# Patient Record
Sex: Female | Born: 1955 | Race: White | Hispanic: No | Smoking: Never smoker
Health system: Southern US, Community
[De-identification: ages and names within clinical notes are randomized; demographics above are authoritative.]

## PROBLEM LIST (undated history)

## (undated) DIAGNOSIS — B029 Zoster without complications: Secondary | ICD-10-CM

## (undated) DIAGNOSIS — I471 Supraventricular tachycardia, unspecified: Secondary | ICD-10-CM

## (undated) DIAGNOSIS — E785 Hyperlipidemia, unspecified: Secondary | ICD-10-CM

## (undated) DIAGNOSIS — E039 Hypothyroidism, unspecified: Secondary | ICD-10-CM

## (undated) DIAGNOSIS — F1010 Alcohol abuse, uncomplicated: Secondary | ICD-10-CM

## (undated) DIAGNOSIS — J302 Other seasonal allergic rhinitis: Secondary | ICD-10-CM

## (undated) DIAGNOSIS — E079 Disorder of thyroid, unspecified: Secondary | ICD-10-CM

## (undated) DIAGNOSIS — I10 Essential (primary) hypertension: Secondary | ICD-10-CM

## (undated) DIAGNOSIS — I639 Cerebral infarction, unspecified: Secondary | ICD-10-CM

## (undated) DIAGNOSIS — F419 Anxiety disorder, unspecified: Secondary | ICD-10-CM

## (undated) DIAGNOSIS — F32A Depression, unspecified: Secondary | ICD-10-CM

## (undated) DIAGNOSIS — F329 Major depressive disorder, single episode, unspecified: Secondary | ICD-10-CM

## (undated) HISTORY — PX: TUBAL LIGATION: SHX77

## (undated) HISTORY — DX: Alcohol abuse, uncomplicated: F10.10

## (undated) HISTORY — DX: Supraventricular tachycardia, unspecified: I47.10

## (undated) HISTORY — DX: Other seasonal allergic rhinitis: J30.2

## (undated) HISTORY — DX: Supraventricular tachycardia: I47.1

## (undated) HISTORY — DX: Zoster without complications: B02.9

## (undated) HISTORY — DX: Hypothyroidism, unspecified: E03.9

## (undated) HISTORY — DX: Disorder of thyroid, unspecified: E07.9

## (undated) HISTORY — DX: Hyperlipidemia, unspecified: E78.5

---

## 1998-08-27 ENCOUNTER — Other Ambulatory Visit: Admission: RE | Admit: 1998-08-27 | Discharge: 1998-08-27 | Payer: Self-pay | Admitting: Family Medicine

## 2000-08-26 ENCOUNTER — Encounter: Admission: RE | Admit: 2000-08-26 | Discharge: 2000-08-26 | Payer: Self-pay | Admitting: Family Medicine

## 2001-01-05 ENCOUNTER — Encounter: Admission: RE | Admit: 2001-01-05 | Discharge: 2001-01-05 | Payer: Self-pay | Admitting: Family Medicine

## 2004-06-09 ENCOUNTER — Other Ambulatory Visit: Admission: RE | Admit: 2004-06-09 | Discharge: 2004-06-09 | Payer: Self-pay | Admitting: Family Medicine

## 2004-10-07 ENCOUNTER — Ambulatory Visit (HOSPITAL_COMMUNITY): Admission: RE | Admit: 2004-10-07 | Discharge: 2004-10-07 | Payer: Self-pay | Admitting: Family Medicine

## 2007-05-30 ENCOUNTER — Other Ambulatory Visit: Admission: RE | Admit: 2007-05-30 | Discharge: 2007-05-30 | Payer: Self-pay | Admitting: Family Medicine

## 2007-06-21 ENCOUNTER — Encounter (INDEPENDENT_AMBULATORY_CARE_PROVIDER_SITE_OTHER): Payer: Self-pay | Admitting: Family Medicine

## 2007-06-21 ENCOUNTER — Ambulatory Visit: Admission: RE | Admit: 2007-06-21 | Discharge: 2007-06-21 | Payer: Self-pay | Admitting: Family Medicine

## 2008-06-05 ENCOUNTER — Other Ambulatory Visit: Admission: RE | Admit: 2008-06-05 | Discharge: 2008-06-05 | Payer: Self-pay | Admitting: Family Medicine

## 2009-04-01 ENCOUNTER — Ambulatory Visit (HOSPITAL_COMMUNITY): Admission: RE | Admit: 2009-04-01 | Discharge: 2009-04-01 | Payer: Self-pay | Admitting: Family Medicine

## 2010-05-09 ENCOUNTER — Emergency Department (HOSPITAL_BASED_OUTPATIENT_CLINIC_OR_DEPARTMENT_OTHER)
Admission: EM | Admit: 2010-05-09 | Discharge: 2010-05-09 | Payer: Self-pay | Source: Home / Self Care | Admitting: Emergency Medicine

## 2010-07-20 ENCOUNTER — Other Ambulatory Visit: Payer: Self-pay | Admitting: Obstetrics and Gynecology

## 2010-07-27 LAB — POCT CARDIAC MARKERS
Troponin i, poc: 0.38 ng/mL (ref 0.00–0.09)
Troponin i, poc: <0.05 ng/mL (ref 0.00–0.09)

## 2010-07-27 LAB — DIFFERENTIAL
Basophils Relative: 0 % (ref 0–1)
Eosinophils Absolute: 0.3 10*3/uL (ref 0.0–0.7)
Lymphs Abs: 6.2 10*3/uL — ABNORMAL HIGH (ref 0.7–4.0)
Monocytes Absolute: 1.6 10*3/uL — ABNORMAL HIGH (ref 0.1–1.0)
Neutro Abs: 8.1 10*3/uL — ABNORMAL HIGH (ref 1.7–7.7)

## 2010-07-27 LAB — BASIC METABOLIC PANEL
CO2: 26 mEq/L (ref 19–32)
Glucose, Bld: 164 mg/dL — ABNORMAL HIGH (ref 70–99)
Potassium: 3.6 mEq/L (ref 3.5–5.1)
Sodium: 144 mEq/L (ref 135–145)

## 2010-07-27 LAB — CBC
HCT: 44.3 % (ref 36.0–46.0)
Hemoglobin: 15.9 g/dL — ABNORMAL HIGH (ref 12.0–15.0)
MCH: 30.5 pg (ref 26.0–34.0)
MCHC: 35.9 g/dL (ref 30.0–36.0)
MCV: 85 fL (ref 78.0–100.0)

## 2012-11-27 ENCOUNTER — Other Ambulatory Visit (HOSPITAL_COMMUNITY)
Admission: RE | Admit: 2012-11-27 | Discharge: 2012-11-27 | Disposition: A | Discharge status: Home / Self Care | Payer: BC Managed Care – PPO | Source: Ambulatory Visit | Attending: Family Medicine | Admitting: Family Medicine

## 2012-11-27 ENCOUNTER — Other Ambulatory Visit: Payer: Self-pay | Admitting: Physician Assistant

## 2012-11-27 DIAGNOSIS — Z0000 Encounter for general adult medical examination without abnormal findings: Secondary | ICD-10-CM | POA: Insufficient documentation

## 2013-05-15 ENCOUNTER — Other Ambulatory Visit (HOSPITAL_COMMUNITY): Payer: Self-pay | Admitting: Family Medicine

## 2013-05-15 DIAGNOSIS — Z1231 Encounter for screening mammogram for malignant neoplasm of breast: Secondary | ICD-10-CM

## 2013-05-25 ENCOUNTER — Ambulatory Visit (HOSPITAL_COMMUNITY)
Admission: RE | Admit: 2013-05-25 | Discharge: 2013-05-25 | Disposition: A | Discharge status: Home / Self Care | Payer: BC Managed Care – PPO | Source: Ambulatory Visit | Attending: Family Medicine | Admitting: Family Medicine

## 2013-05-25 DIAGNOSIS — Z1231 Encounter for screening mammogram for malignant neoplasm of breast: Secondary | ICD-10-CM | POA: Insufficient documentation

## 2014-03-09 ENCOUNTER — Encounter: Payer: Self-pay | Admitting: *Deleted

## 2014-04-14 ENCOUNTER — Inpatient Hospital Stay (HOSPITAL_COMMUNITY)
Admission: EM | Admit: 2014-04-14 | Discharge: 2014-04-17 | DRG: 065 | Disposition: A | Discharge status: Home / Self Care | Payer: BC Managed Care – PPO | Attending: Internal Medicine | Admitting: Internal Medicine

## 2014-04-14 ENCOUNTER — Encounter (HOSPITAL_COMMUNITY): Payer: Self-pay | Admitting: *Deleted

## 2014-04-14 DIAGNOSIS — F419 Anxiety disorder, unspecified: Secondary | ICD-10-CM | POA: Diagnosis present

## 2014-04-14 DIAGNOSIS — H532 Diplopia: Secondary | ICD-10-CM | POA: Diagnosis not present

## 2014-04-14 DIAGNOSIS — I63432 Cerebral infarction due to embolism of left posterior cerebral artery: Principal | ICD-10-CM | POA: Diagnosis present

## 2014-04-14 DIAGNOSIS — F329 Major depressive disorder, single episode, unspecified: Secondary | ICD-10-CM | POA: Diagnosis present

## 2014-04-14 DIAGNOSIS — Z8673 Personal history of transient ischemic attack (TIA), and cerebral infarction without residual deficits: Secondary | ICD-10-CM

## 2014-04-14 DIAGNOSIS — E785 Hyperlipidemia, unspecified: Secondary | ICD-10-CM | POA: Diagnosis present

## 2014-04-14 DIAGNOSIS — Z79899 Other long term (current) drug therapy: Secondary | ICD-10-CM

## 2014-04-14 DIAGNOSIS — I639 Cerebral infarction, unspecified: Secondary | ICD-10-CM

## 2014-04-14 DIAGNOSIS — E039 Hypothyroidism, unspecified: Secondary | ICD-10-CM | POA: Diagnosis present

## 2014-04-14 DIAGNOSIS — I10 Essential (primary) hypertension: Secondary | ICD-10-CM | POA: Diagnosis present

## 2014-04-14 DIAGNOSIS — F1099 Alcohol use, unspecified with unspecified alcohol-induced disorder: Secondary | ICD-10-CM | POA: Diagnosis present

## 2014-04-14 HISTORY — DX: Essential (primary) hypertension: I10

## 2014-04-14 HISTORY — DX: Cerebral infarction, unspecified: I63.9

## 2014-04-14 HISTORY — DX: Depression, unspecified: F32.A

## 2014-04-14 HISTORY — DX: Anxiety disorder, unspecified: F41.9

## 2014-04-14 HISTORY — DX: Major depressive disorder, single episode, unspecified: F32.9

## 2014-04-14 LAB — COMPREHENSIVE METABOLIC PANEL
ALBUMIN: 4.2 g/dL (ref 3.5–5.2)
ALT: 35 U/L (ref 0–35)
AST: 26 U/L (ref 0–37)
Alkaline Phosphatase: 106 U/L (ref 39–117)
Anion gap: 12 (ref 5–15)
BUN: 20 mg/dL (ref 6–23)
CALCIUM: 10.2 mg/dL (ref 8.4–10.5)
CHLORIDE: 97 meq/L (ref 96–112)
CO2: 30 mEq/L (ref 19–32)
Creatinine, Ser: 0.74 mg/dL (ref 0.50–1.10)
GFR calc Af Amer: >90 mL/min (ref >90)
GFR calc non Af Amer: >90 mL/min (ref >90)
Glucose, Bld: 100 mg/dL — ABNORMAL HIGH (ref 70–99)
Potassium: 3.6 mEq/L — ABNORMAL LOW (ref 3.7–5.3)
Sodium: 139 mEq/L (ref 137–147)
Total Bilirubin: 0.6 mg/dL (ref 0.3–1.2)
Total Protein: 7.8 g/dL (ref 6.0–8.3)

## 2014-04-14 LAB — DIFFERENTIAL
BASOS ABS: 0 10*3/uL (ref 0.0–0.1)
BASOS PCT: 0 % (ref 0–1)
EOS PCT: 2 % (ref 0–5)
Eosinophils Absolute: 0.2 10*3/uL (ref 0.0–0.7)
Lymphocytes Relative: 31 % (ref 12–46)
Lymphs Abs: 3.1 10*3/uL (ref 0.7–4.0)
Monocytes Absolute: 1 10*3/uL (ref 0.1–1.0)
Monocytes Relative: 10 % (ref 3–12)
NEUTROS ABS: 5.9 10*3/uL (ref 1.7–7.7)
Neutrophils Relative %: 57 % (ref 43–77)

## 2014-04-14 LAB — I-STAT TROPONIN, ED: TROPONIN I, POC: 0 ng/mL (ref 0.00–0.08)

## 2014-04-14 LAB — CBC
HEMATOCRIT: 44.5 % (ref 36.0–46.0)
Hemoglobin: 15.6 g/dL — ABNORMAL HIGH (ref 12.0–15.0)
MCH: 31.1 pg (ref 26.0–34.0)
MCHC: 35.1 g/dL (ref 30.0–36.0)
MCV: 88.8 fL (ref 78.0–100.0)
PLATELETS: 296 10*3/uL (ref 150–400)
RBC: 5.01 MIL/uL (ref 3.87–5.11)
RDW: 11.8 % (ref 11.5–15.5)
WBC: 10.2 10*3/uL (ref 4.0–10.5)

## 2014-04-14 LAB — PROTIME-INR
INR: 1.06 (ref 0.00–1.49)
PROTHROMBIN TIME: 13.9 s (ref 11.6–15.2)

## 2014-04-14 LAB — APTT: APTT: 31 s (ref 24–37)

## 2014-04-14 NOTE — ED Notes (Signed)
Pt reports onset Wednesday of dizziness, double vision and generalized fatigue. Grips are equal and no neuro deficits noted at triage.

## 2014-04-14 NOTE — ED Notes (Signed)
Pt placed in a gown with cardiac monitoring, BP cuff and pulse ox

## 2014-04-14 NOTE — ED Provider Notes (Signed)
CSN: [id number]     Arrival date & time 04/14/14  1805 History   First MD Initiated Contact with Patient 04/14/14 2011     Chief Complaint  Patient presents with  . Dizziness  . Diplopia  . Fatigue    (Consider location/radiation/quality/duration/timing/severity/associated sxs/prior Treatment) HPI Comments: Patient with history of hypothyroidism, high cholesterol, high blood pressure was dense with complaint of binocular diplopia beginning 4 days ago. At the onset the patient had an episode of dizziness described as a spinning sensation. Patient states that she went to sleep and when she awoke she was having improved dizziness but double vision. Double vision is constant and described as "annoying". When she closes one eye the double vision resolves. Patient sees 2 objects that can be beside each other or on top of one another. She thinks that this is slightly worse with leftward gaze or when the objects are right in front of her. Patient denies signs of stroke including: facial droop, slurred speech, aphasia, weakness/numbness in extremities, imbalance/trouble walking. She does have family histroy of stroke. No fever, neck pain, N/V/D. No treatments PTA. She has never had symptoms like this in the past. Her last synthroid adjustment was 8-10 months ago. No blinking lights in her vision or loss of vision. No head injuries. No pain around her eye or headache.   Patient is a 58 y.o. female presenting with dizziness. The history is provided by the patient.  Dizziness Associated symptoms: headaches   Associated symptoms: no chest pain, no nausea, no shortness of breath and no vomiting     Past Medical History  Diagnosis Date  . Thyroid disease   . Hyperlipidemia   . Hypothyroidism   . SVT (supraventricular tachycardia)   . Shingles    History reviewed. No pertinent past surgical history. Family History  Problem Relation Age of Onset  . Coronary artery disease Mother   . Coronary artery  disease Brother   . Heart attack Brother    History  Substance Use Topics  . Smoking status: Never Smoker   . Smokeless tobacco: Not on file  . Alcohol Use: No   OB History    No data available     Review of Systems  Constitutional: Negative for fever.  HENT: Negative for congestion, dental problem, rhinorrhea and sinus pressure.   Eyes: Negative for photophobia, discharge, redness and visual disturbance.  Respiratory: Negative for shortness of breath.   Cardiovascular: Negative for chest pain.  Gastrointestinal: Negative for nausea and vomiting.  Musculoskeletal: Negative for gait problem, neck pain and neck stiffness.  Skin: Negative for rash.  Neurological: Positive for dizziness and headaches. Negative for syncope, speech difficulty, weakness, light-headedness and numbness.  Psychiatric/Behavioral: Negative for confusion.    Allergies  Amoxicillin  Home Medications   Prior to Admission medications   Medication Sig Start Date End Date Taking? Authorizing Provider  ALPRAZolam Prudy Feeler(XANAX) 0.5 MG tablet Take 0.5 mg by mouth at bedtime as needed for anxiety.   Yes Historical Provider, MD  atorvastatin (LIPITOR) 40 MG tablet Take 40 mg by mouth daily.   Yes Historical Provider, MD  DULoxetine (CYMBALTA) 60 MG capsule Take 60 mg by mouth daily.   Yes Historical Provider, MD  levothyroxine (SYNTHROID, LEVOTHROID) 112 MCG tablet Take 124 mcg by mouth daily.    Yes Historical Provider, MD  lisinopril-hydrochlorothiazide (PRINZIDE,ZESTORETIC) 20-25 MG per tablet Take 1 tablet by mouth daily.   Yes Historical Provider, MD  metoprolol succinate (TOPROL-XL) 100 MG 24 hr tablet  Take 100 mg by mouth daily. Take with or immediately following a meal.   Yes Historical Provider, MD   BP 120/93 mmHg  Pulse 70  Temp(Src) 97.5 F (36.4 C) (Oral)  Resp 20  Ht 5\' 2"  (1.575 m)  Wt 177 lb (80.287 kg)  BMI 32.37 kg/m2  SpO2 96%   Physical Exam  Constitutional: She is oriented to person, place,  and time. She appears well-developed and well-nourished.  HENT:  Head: Normocephalic and atraumatic.  Right Ear: Tympanic membrane, external ear and ear canal normal.  Left Ear: Tympanic membrane, external ear and ear canal normal.  Nose: Nose normal.  Mouth/Throat: Uvula is midline, oropharynx is clear and moist and mucous membranes are normal.  Eyes: Conjunctivae, EOM and lids are normal. Pupils are equal, round, and reactive to light. Right eye exhibits no nystagmus. Left eye exhibits no nystagmus.  Neck: Normal range of motion. Neck supple.  Cardiovascular: Normal rate and regular rhythm.   No murmur heard. Pulmonary/Chest: Effort normal and breath sounds normal. No respiratory distress. She has no wheezes. She has no rales.  Abdominal: Soft. There is no tenderness.  Musculoskeletal:       Cervical back: She exhibits normal range of motion, no tenderness and no bony tenderness.  Neurological: She is alert and oriented to person, place, and time. She has normal strength and normal reflexes. A cranial nerve deficit is present. No sensory deficit. She displays a negative Romberg sign. Coordination and gait normal. GCS eye subscore is 4. GCS verbal subscore is 5. GCS motor subscore is 6.  Right eye deviates downward when eye covered.   Skin: Skin is warm and dry.  Psychiatric: She has a normal mood and affect.  Nursing note and vitals reviewed.   ED Course  Procedures (including critical care time) Labs Review Labs Reviewed  CBC - Abnormal; Notable for the following:    Hemoglobin 15.6 (*)    All other components within normal limits  COMPREHENSIVE METABOLIC PANEL - Abnormal; Notable for the following:    Potassium 3.6 (*)    Glucose, Bld 100 (*)    All other components within normal limits  PROTIME-INR  APTT  DIFFERENTIAL  I-STAT TROPOININ, ED    Imaging Review No results found.   EKG Interpretation None       8:58 PM Patient seen and examined. Work-up initiated.   Vital signs reviewed and are as follows: BP 120/93 mmHg  Pulse 70  Temp(Src) 97.5 F (36.4 C) (Oral)  Resp 20  Ht 5\' 2"  (1.575 m)  Wt 177 lb (80.287 kg)  BMI 32.37 kg/m2  SpO2 96%  11:08 PM Patient discussed with and seen previously by Dr. Rhunette CroftNanavati. I spoke with Dr. Roseanne RenoStewart earlier regarding this. He reccs MRI if patient is having binocular diplopia. Plan: Will obtain MRI in morning. Patient is willing to stay for this.   If the MRI is negative, she can be discharged home with PCP follow-up for completion of stroke work-up. If it is positive, she will need admitted.   1:19 AM Handoff to Upstill PA-C at shift change.   BP 137/81 mmHg  Pulse 69  Temp(Src) 98 F (36.7 C) (Oral)  Resp 17  Ht 5\' 2"  (1.575 m)  Wt 177 lb (80.287 kg)  BMI 32.37 kg/m2  SpO2 94%   MDM   Final diagnoses:  Diplopia   Pending completion of work-up. Due to MRI availability -- patient will hold in ED overnight and get scan in morning.  She is in agreement.      Renne Crigler, PA-C 04/15/14 1610  Derwood Kaplan, MD 04/21/14 [phone]

## 2014-04-15 ENCOUNTER — Inpatient Hospital Stay (HOSPITAL_COMMUNITY): Payer: BC Managed Care – PPO

## 2014-04-15 ENCOUNTER — Encounter (HOSPITAL_COMMUNITY): Payer: Self-pay | Admitting: General Practice

## 2014-04-15 ENCOUNTER — Emergency Department (HOSPITAL_COMMUNITY): Payer: BC Managed Care – PPO

## 2014-04-15 DIAGNOSIS — Z79899 Other long term (current) drug therapy: Secondary | ICD-10-CM | POA: Diagnosis not present

## 2014-04-15 DIAGNOSIS — E039 Hypothyroidism, unspecified: Secondary | ICD-10-CM | POA: Diagnosis present

## 2014-04-15 DIAGNOSIS — I517 Cardiomegaly: Secondary | ICD-10-CM

## 2014-04-15 DIAGNOSIS — H532 Diplopia: Secondary | ICD-10-CM | POA: Diagnosis present

## 2014-04-15 DIAGNOSIS — E038 Other specified hypothyroidism: Secondary | ICD-10-CM

## 2014-04-15 DIAGNOSIS — E785 Hyperlipidemia, unspecified: Secondary | ICD-10-CM | POA: Diagnosis present

## 2014-04-15 DIAGNOSIS — F329 Major depressive disorder, single episode, unspecified: Secondary | ICD-10-CM | POA: Diagnosis present

## 2014-04-15 DIAGNOSIS — I10 Essential (primary) hypertension: Secondary | ICD-10-CM

## 2014-04-15 DIAGNOSIS — F419 Anxiety disorder, unspecified: Secondary | ICD-10-CM | POA: Diagnosis present

## 2014-04-15 DIAGNOSIS — Z8673 Personal history of transient ischemic attack (TIA), and cerebral infarction without residual deficits: Secondary | ICD-10-CM | POA: Diagnosis not present

## 2014-04-15 DIAGNOSIS — I639 Cerebral infarction, unspecified: Secondary | ICD-10-CM | POA: Diagnosis present

## 2014-04-15 DIAGNOSIS — F1099 Alcohol use, unspecified with unspecified alcohol-induced disorder: Secondary | ICD-10-CM | POA: Diagnosis present

## 2014-04-15 DIAGNOSIS — I63432 Cerebral infarction due to embolism of left posterior cerebral artery: Secondary | ICD-10-CM | POA: Diagnosis present

## 2014-04-15 LAB — CBC
HEMATOCRIT: 41.9 % (ref 36.0–46.0)
Hemoglobin: 14.4 g/dL (ref 12.0–15.0)
MCH: 30.4 pg (ref 26.0–34.0)
MCHC: 34.4 g/dL (ref 30.0–36.0)
MCV: 88.6 fL (ref 78.0–100.0)
Platelets: 265 10*3/uL (ref 150–400)
RBC: 4.73 MIL/uL (ref 3.87–5.11)
RDW: 11.8 % (ref 11.5–15.5)
WBC: 9.5 10*3/uL (ref 4.0–10.5)

## 2014-04-15 LAB — CREATININE, SERUM
Creatinine, Ser: 0.83 mg/dL (ref 0.50–1.10)
GFR calc Af Amer: 90 mL/min — ABNORMAL LOW (ref >90)
GFR, EST NON AFRICAN AMERICAN: 77 mL/min — AB (ref >90)

## 2014-04-15 MED ORDER — ATORVASTATIN CALCIUM 40 MG PO TABS
40.0000 mg | ORAL_TABLET | Freq: Every day | ORAL | Status: DC
  Administered 2014-04-15 – 2014-04-16 (×2): 40 mg via ORAL
  Filled 2014-04-15 (×4): qty 1

## 2014-04-15 MED ORDER — HYDROCHLOROTHIAZIDE 25 MG PO TABS
25.0000 mg | ORAL_TABLET | Freq: Every day | ORAL | Status: DC
  Administered 2014-04-15: 25 mg via ORAL
  Filled 2014-04-15: qty 1

## 2014-04-15 MED ORDER — SODIUM CHLORIDE 0.9 % IV SOLN: 250.0000 mL | INTRAVENOUS | Status: DC | PRN

## 2014-04-15 MED ORDER — INFLUENZA VAC SPLIT QUAD 0.5 ML IM SUSY
0.5000 mL | PREFILLED_SYRINGE | INTRAMUSCULAR | Status: AC
  Administered 2014-04-16: 0.5 mL via INTRAMUSCULAR
  Filled 2014-04-15: qty 0.5

## 2014-04-15 MED ORDER — ASPIRIN 81 MG PO CHEW
81.0000 mg | CHEWABLE_TABLET | Freq: Every day | ORAL | Status: DC
  Administered 2014-04-15 – 2014-04-16 (×2): 81 mg via ORAL
  Filled 2014-04-15 (×3): qty 1

## 2014-04-15 MED ORDER — VITAMIN B-1 100 MG PO TABS
100.0000 mg | ORAL_TABLET | Freq: Every day | ORAL | Status: DC
  Administered 2014-04-15 – 2014-04-16 (×2): 100 mg via ORAL
  Filled 2014-04-15 (×3): qty 1

## 2014-04-15 MED ORDER — FOLIC ACID 1 MG PO TABS
1.0000 mg | ORAL_TABLET | Freq: Every day | ORAL | Status: DC
  Administered 2014-04-15 – 2014-04-16 (×2): 1 mg via ORAL
  Filled 2014-04-15 (×3): qty 1

## 2014-04-15 MED ORDER — ADULT MULTIVITAMIN W/MINERALS CH
1.0000 | ORAL_TABLET | Freq: Every day | ORAL | Status: DC
  Administered 2014-04-15 – 2014-04-16 (×2): 1 via ORAL
  Filled 2014-04-15 (×3): qty 1

## 2014-04-15 MED ORDER — ACETAMINOPHEN 650 MG RE SUPP: 650.0000 mg | Freq: Four times a day (QID) | RECTAL | Status: DC | PRN

## 2014-04-15 MED ORDER — SODIUM CHLORIDE 0.9 % IJ SOLN
3.0000 mL | Freq: Two times a day (BID) | INTRAMUSCULAR | Status: DC
  Administered 2014-04-15: 3 mL via INTRAVENOUS

## 2014-04-15 MED ORDER — LORAZEPAM 2 MG/ML IJ SOLN: 1.0000 mg | Freq: Four times a day (QID) | INTRAMUSCULAR | Status: DC | PRN

## 2014-04-15 MED ORDER — SODIUM CHLORIDE 0.9 % IJ SOLN
3.0000 mL | Freq: Two times a day (BID) | INTRAMUSCULAR | Status: DC
  Administered 2014-04-15 – 2014-04-16 (×2): 3 mL via INTRAVENOUS

## 2014-04-15 MED ORDER — LEVOTHYROXINE SODIUM 112 MCG PO TABS
224.0000 ug | ORAL_TABLET | Freq: Every day | ORAL | Status: DC
  Administered 2014-04-15 – 2014-04-16 (×2): 224 ug via ORAL
  Filled 2014-04-15 (×4): qty 2

## 2014-04-15 MED ORDER — DOCUSATE SODIUM 100 MG PO CAPS
100.0000 mg | ORAL_CAPSULE | Freq: Two times a day (BID) | ORAL | Status: DC
  Administered 2014-04-15: 100 mg via ORAL
  Filled 2014-04-15 (×5): qty 1

## 2014-04-15 MED ORDER — LEVOTHYROXINE SODIUM 25 MCG PO TABS: 124.0000 ug | ORAL_TABLET | Freq: Every day | ORAL | Status: DC

## 2014-04-15 MED ORDER — HYDROCODONE-ACETAMINOPHEN 5-325 MG PO TABS: 1.0000 | ORAL_TABLET | ORAL | Status: DC | PRN

## 2014-04-15 MED ORDER — SODIUM CHLORIDE 0.9 % IJ SOLN: 3.0000 mL | INTRAMUSCULAR | Status: DC | PRN

## 2014-04-15 MED ORDER — ONDANSETRON HCL 4 MG PO TABS
4.0000 mg | ORAL_TABLET | Freq: Four times a day (QID) | ORAL | Status: DC | PRN
Start: 2014-04-15 — End: 2014-04-17

## 2014-04-15 MED ORDER — DULOXETINE HCL 60 MG PO CPEP
60.0000 mg | ORAL_CAPSULE | Freq: Every day | ORAL | Status: DC
  Administered 2014-04-15 – 2014-04-16 (×2): 60 mg via ORAL
  Filled 2014-04-15 (×3): qty 1

## 2014-04-15 MED ORDER — HEPARIN SODIUM (PORCINE) 5000 UNIT/ML IJ SOLN
5000.0000 [IU] | Freq: Three times a day (TID) | INTRAMUSCULAR | Status: DC
  Administered 2014-04-15 – 2014-04-17 (×5): 5000 [IU] via SUBCUTANEOUS
  Filled 2014-04-15 (×7): qty 1

## 2014-04-15 MED ORDER — ONDANSETRON HCL 4 MG/2ML IJ SOLN: 4.0000 mg | Freq: Four times a day (QID) | INTRAMUSCULAR | Status: DC | PRN

## 2014-04-15 MED ORDER — ALPRAZOLAM 0.5 MG PO TABS: 0.5000 mg | ORAL_TABLET | Freq: Every evening | ORAL | Status: DC | PRN

## 2014-04-15 MED ORDER — METOPROLOL SUCCINATE ER 100 MG PO TB24
100.0000 mg | ORAL_TABLET | Freq: Every day | ORAL | Status: DC
  Administered 2014-04-15: 100 mg via ORAL
  Filled 2014-04-15: qty 4

## 2014-04-15 MED ORDER — LORAZEPAM 1 MG PO TABS: 1.0000 mg | ORAL_TABLET | Freq: Four times a day (QID) | ORAL | Status: DC | PRN

## 2014-04-15 MED ORDER — ACETAMINOPHEN 325 MG PO TABS: 650.0000 mg | ORAL_TABLET | Freq: Four times a day (QID) | ORAL | Status: DC | PRN

## 2014-04-15 MED ORDER — LISINOPRIL 20 MG PO TABS
20.0000 mg | ORAL_TABLET | Freq: Every day | ORAL | Status: DC
  Administered 2014-04-15: 20 mg via ORAL
  Filled 2014-04-15: qty 1

## 2014-04-15 MED ORDER — THIAMINE HCL 100 MG/ML IJ SOLN
100.0000 mg | Freq: Every day | INTRAMUSCULAR | Status: DC
  Filled 2014-04-15 (×3): qty 1

## 2014-04-15 MED ORDER — GADOBENATE DIMEGLUMINE 529 MG/ML IV SOLN
17.0000 mL | Freq: Once | INTRAVENOUS | Status: AC | PRN
  Administered 2014-04-15: 17 mL via INTRAVENOUS

## 2014-04-15 NOTE — Progress Notes (Signed)
PT Cancellation Note  Patient Details Name: Nicholaus CorollaJoan Pickler MRN: [id number] DOB: 07-23-1955   Cancelled Treatment:    Reason Eval/Treat Not Completed: Patient at procedure or test/unavailable (Pt in MRI.  )   Berline LopesWhite, Malasha Kleppe F 04/15/2014, 2:18 PM  Andreea Arca,PT Acute Rehabilitation [phone] (pager)

## 2014-04-15 NOTE — ED Provider Notes (Signed)
6:35 AM Pt signed out at shift change by Elpidio AnisShari Upstill, PA-C.  Pt presented to ED last night with c/o diplopia.  Pt was discussed with Dr. Rhunette CroftNanavati and Dr. Roseanne RenoStewart.  MRI was recommended due to pt having binocular diplopia.  Plan is to f/u on MRI. If normal, pt may f/u with PCP, Dr. Fernanda DrumSwayne, [facility].  7:56 AM MRI Brain- significant for acute left thalamic infarct. Minimal chronic small vessel ischemia disease.  Will consult with neurology again.   8:06 AM Consulted with neurology who reviewed MRI, will come see pt. Requested pt be admitted to medicine service.   8:49 AM Consulted with Dr. Criselda PeachesMullen, who agreed to admit pt for continued care.  Pt is stable at this time.   Results for orders placed or performed during the hospital encounter of 04/14/14  Protime-INR  Result Value Ref Range   Prothrombin Time 13.9 11.6 - 15.2 seconds   INR 1.06 0.00 - 1.49  APTT  Result Value Ref Range   aPTT 31 24 - 37 seconds  CBC  Result Value Ref Range   WBC 10.2 4.0 - 10.5 K/uL   RBC 5.01 3.87 - 5.11 MIL/uL   Hemoglobin 15.6 (H) 12.0 - 15.0 g/dL   HCT 16.144.5 09.636.0 - 04.546.0 %   MCV 88.8 78.0 - 100.0 fL   MCH 31.1 26.0 - 34.0 pg   MCHC 35.1 30.0 - 36.0 g/dL   RDW 40.911.8 81.111.5 - 91.415.5 %   Platelets 296 150 - 400 K/uL  Differential  Result Value Ref Range   Neutrophils Relative % 57 43 - 77 %   Neutro Abs 5.9 1.7 - 7.7 K/uL   Lymphocytes Relative 31 12 - 46 %   Lymphs Abs 3.1 0.7 - 4.0 K/uL   Monocytes Relative 10 3 - 12 %   Monocytes Absolute 1.0 0.1 - 1.0 K/uL   Eosinophils Relative 2 0 - 5 %   Eosinophils Absolute 0.2 0.0 - 0.7 K/uL   Basophils Relative 0 0 - 1 %   Basophils Absolute 0.0 0.0 - 0.1 K/uL  Comprehensive metabolic panel  Result Value Ref Range   Sodium 139 137 - 147 mEq/L   Potassium 3.6 (L) 3.7 - 5.3 mEq/L   Chloride 97 96 - 112 mEq/L   CO2 30 19 - 32 mEq/L   Glucose, Bld 100 (H) 70 - 99 mg/dL   BUN 20 6 - 23 mg/dL   Creatinine, Ser 7.820.74 0.50 - 1.10 mg/dL   Calcium 95.610.2 8.4 -  21.310.5 mg/dL   Total Protein 7.8 6.0 - 8.3 g/dL   Albumin 4.2 3.5 - 5.2 g/dL   AST 26 0 - 37 U/L   ALT 35 0 - 35 U/L   Alkaline Phosphatase 106 39 - 117 U/L   Total Bilirubin 0.6 0.3 - 1.2 mg/dL   GFR calc non Af Amer >90 >90 mL/min   GFR calc Af Amer >90 >90 mL/min   Anion gap 12 5 - 15  I-stat troponin, ED ([facility])  Result Value Ref Range   Troponin i, poc 0.00 0.00 - 0.08 ng/mL   Comment 3           Mr Brain Wo Contrast  04/15/2014   CLINICAL DATA:  Diplopia.  EXAM: MRI HEAD WITHOUT CONTRAST  TECHNIQUE: Multiplanar, multiecho pulse sequences of the brain and surrounding structures were obtained without intravenous contrast.  COMPARISON:  None.  FINDINGS: There are small foci of acute infarction in the left  thalamus slightly extending into the left cerebral peduncle. There is no evidence of intracranial hemorrhage, mass, midline shift, or extra-axial fluid collection. Ventricles and sulci are within normal limits for age. Scattered, small foci of T2 hyperintensity in the subcortical and deep cerebral white matter bilaterally are nonspecific but compatible with minimal chronic small vessel ischemic disease.  Orbits are unremarkable. There is mild right anterior ethmoid air cell mucosal thickening. Small left mastoid effusion is noted. Major intracranial vascular flow voids are preserved.  IMPRESSION: 1. Acute left thalamic infarct. 2. Minimal chronic small vessel ischemic disease.   Electronically Signed   By: Sebastian AcheAllen  Grady   On: 04/15/2014 07:44       Junius FinnerErin O'Malley, PA-C 04/15/14 [id number]  Ward GivensIva L Knapp, MD 04/15/14 [phone]

## 2014-04-15 NOTE — ED Provider Notes (Signed)
[place] pending MRI in am to r/o CVA acutely. PCP f/u if negative. Consider outpatient ophtho f/u facilitated by PCP.  MRI pending at morning shift change. [facility] for result review and disposition.  Arnoldo HookerShari A Jaylyne Breese, PA-C 04/17/14 1914

## 2014-04-15 NOTE — H&P (Signed)
[facility] History and Physical  [name] Wagner ZOX:[id number] DOB: 11-18-1955 DOA: 04/14/2014  Referring physician: ED physician PCP: Sissy Hoff, MD   Chief Complaint: dizziness, diplopia  HPI:  [name] Wagner is a 58yo woman with PMH of HTN, hypothyroidism, h/o SVT and HLD who presented due to above.  [name] Wagner reports that she began having symptoms on Wednesday, particularly dizziness and blurred vision and double vision.  She reports that she had to work, so she did not come in to be evaluated.  The dizziness improved after one day, but the vision changes persisted.  She denied any other symptoms including chest pain, fever, chills, nausea, vomiting, diarrhea, constipation, change in hearing, tingling, weakness, lightheadedness, syncope, change in sensation, inability to walk or gait changes.  Per her daughter, she had some mental status changes this morning and possibly some slurred speech, but these have all completely resolved.  The double vision is the only thing that persists.   Assessment and Plan: Principal Problem:    CVA; left thalamic stroke - MRI and MRA as noted below - Check A1C, Lipid panel - Allow permissive HTN - Neurology is following along - TTE, CD pending - PT/OT/SLP - she passed bedside swallow - Good risk factor management - Aspirin, statin  Active Problems:    HTN (hypertension) - Allow permissive HTN, hold BP medications including metoprolol, lisinopril-hctz - She is normotensive now, had BP medications this morning.     HLD (hyperlipidemia) - Continue home atorvastatin    Hypothyroidism - Continue home synthroid  ETOH use - Reports 1 bottle of wine a night - CIWA protocol as she will not get any ETOH here.  She does not report any history of withdrawal.    Depression/anxiety - Continue home cymbalta and xanax   Radiological Exams on Admission: Mr Shirlee Latch WJ Contrast  04/15/2014   ADDENDUM REPORT: 04/15/2014 14:18  ADDENDUM:  Study discussed by telephone with Neurohospitalist Dr. Elspeth Cho On 04/15/2014 at 1414 hrs.   Electronically Signed   By: Augusto Gamble M.D.   On: 04/15/2014 14:18   04/15/2014   CLINICAL DATA:  58 year old female with small acute left thalamic and midbrain infarct detected on MRI for evaluation of diplopia. Initial encounter.  EXAM: MRA NECK WITHOUT AND WITH CONTRAST  MRA HEAD WITHOUT CONTRAST  TECHNIQUE: Multiplanar and multiecho pulse sequences of the neck were obtained without and with intravenous contrast. Angiographic images of the neck were obtained using MRA technique without and with intravenous contast.; Angiographic images of the Circle of Willis were obtained using MRA technique without intravenous contrast.  CONTRAST:  17mL MULTIHANCE GADOBENATE DIMEGLUMINE 529 MG/ML IV SOLN  COMPARISON:  Brain MRI without contrast 0644 hr the same day.  FINDINGS: MRA NECK FINDINGS  Pre contrast time-of-flight neck MRA images. Antegrade flow in both carotid and vertebral arteries in the neck. Fairly codominant vertebral arteries.  Post-contrast MRA images. Three vessel arch configuration with no great vessel origin stenosis.  Moderate irregularity at the right carotid bifurcation (series 19147, image 15) but with no hemodynamically significant stenosis at the right ICA origin or bulb. Negative cervical right ICA otherwise.  Minimal to mild irregularity at the left carotid bifurcation and bulb. No cervical left ICA stenosis.  Normal left vertebral artery origin, the right is not well visualized. Slight dominance of the left vertebral artery. No vertebral artery stenosis elsewhere in the neck. Negative distal vertebral arteries, both PICA origins are patent.  MRA HEAD FINDINGS  Antegrade flow in the posterior  circulation with fairly codominant distal vertebral arteries. Normal PICA origins. Normal vertebrobasilar junction. No basilar artery irregularity or stenosis. SCA origins are within normal limits.  There is a fetal  type right PCA origin, however the left PCA origin is conventional (there is a small left posterior communicating artery, and there is a short 3 mm segment of high-grade stenosis or occlusion in the left P1. See series 3, image 54.  Otherwise the bilateral PCA branches are within normal limits.  Antegrade flow in both ICA siphons. No siphon stenosis. Ophthalmic and posterior communicating artery origins are within normal limits. Normal carotid termini, MCA and ACA origins. Anterior communicating artery is diminutive or absent. Visualized bilateral ACA branches are within normal limits. Visualized bilateral MCA branches are within normal limits.  IMPRESSION: 1. Left PCA P1 short 3 mm segment of occlusion or high-grade stenosis corresponding to the acute left midbrain and thalamus striate artery territory infarct. 2. Otherwise negative intracranial and extracranial posterior circulation. 3. Right greater than left carotid bifurcation atherosclerosis, but no anterior circulation hemodynamically significant stenosis.  Electronically Signed: By: Augusto GambleLee  Hall M.D. On: 04/15/2014 13:54   [name]  04/15/2014   ADDENDUM REPORT: 04/15/2014 14:18  ADDENDUM: Study discussed by telephone with Neurohospitalist Dr. Elspeth ChoPeter Sumner On 04/15/2014 at 1414 hrs.   Electronically Signed   By: Augusto GambleLee  Hall M.D.   On: 04/15/2014 14:18   04/15/2014   CLINICAL DATA:  58 year old female with small acute left thalamic and midbrain infarct detected on MRI for evaluation of diplopia. Initial encounter.  EXAM: MRA NECK WITHOUT AND WITH CONTRAST  MRA HEAD WITHOUT CONTRAST  TECHNIQUE: Multiplanar and multiecho pulse sequences of the neck were obtained without and with intravenous contrast. Angiographic images of the neck were obtained using MRA technique without and with intravenous contast.; Angiographic images of the Circle of Willis were obtained using MRA technique without intravenous contrast.  CONTRAST:  17mL MULTIHANCE  GADOBENATE DIMEGLUMINE 529 MG/ML IV SOLN  COMPARISON:  Brain MRI without contrast 0644 hr the same day.  FINDINGS: MRA NECK FINDINGS  Pre contrast time-of-flight neck MRA images. Antegrade flow in both carotid and vertebral arteries in the neck. Fairly codominant vertebral arteries.  Post-contrast MRA images. Three vessel arch configuration with no great vessel origin stenosis.  Moderate irregularity at the right carotid bifurcation (series [id number], image 15) but with no hemodynamically significant stenosis at the right ICA origin or bulb. Negative cervical right ICA otherwise.  Minimal to mild irregularity at the left carotid bifurcation and bulb. No cervical left ICA stenosis.  Normal left vertebral artery origin, the right is not well visualized. Slight dominance of the left vertebral artery. No vertebral artery stenosis elsewhere in the neck. Negative distal vertebral arteries, both PICA origins are patent.  MRA HEAD FINDINGS  Antegrade flow in the posterior circulation with fairly codominant distal vertebral arteries. Normal PICA origins. Normal vertebrobasilar junction. No basilar artery irregularity or stenosis. SCA origins are within normal limits.  There is a fetal type right PCA origin, however the left PCA origin is conventional (there is a small left posterior communicating artery, and there is a short 3 mm segment of high-grade stenosis or occlusion in the left P1. See series 3, image 54.  Otherwise the bilateral PCA branches are within normal limits.  Antegrade flow in both ICA siphons. No siphon stenosis. Ophthalmic and posterior communicating artery origins are within normal limits. Normal carotid termini, MCA and ACA origins. Anterior communicating artery is diminutive or  absent. Visualized bilateral ACA branches are within normal limits. Visualized bilateral MCA branches are within normal limits.  IMPRESSION: 1. Left PCA P1 short 3 mm segment of occlusion or high-grade stenosis corresponding to the  acute left midbrain and thalamus striate artery territory infarct. 2. Otherwise negative intracranial and extracranial posterior circulation. 3. Right greater than left carotid bifurcation atherosclerosis, but no anterior circulation hemodynamically significant stenosis.  Electronically Signed: By: Augusto GambleLee  Hall M.D. On: 04/15/2014 13:54   Mr Brain Wo Contrast  04/15/2014   CLINICAL DATA:  Diplopia.  EXAM: MRI HEAD WITHOUT CONTRAST  TECHNIQUE: Multiplanar, multiecho pulse sequences of the brain and surrounding structures were obtained without intravenous contrast.  COMPARISON:  None.  FINDINGS: There are small foci of acute infarction in the left thalamus slightly extending into the left cerebral peduncle. There is no evidence of intracranial hemorrhage, mass, midline shift, or extra-axial fluid collection. Ventricles and sulci are within normal limits for age. Scattered, small foci of T2 hyperintensity in the subcortical and deep cerebral white matter bilaterally are nonspecific but compatible with minimal chronic small vessel ischemic disease.  Orbits are unremarkable. There is mild right anterior ethmoid air cell mucosal thickening. Small left mastoid effusion is noted. Major intracranial vascular flow voids are preserved.  IMPRESSION: 1. Acute left thalamic infarct. 2. Minimal chronic small vessel ischemic disease.   Electronically Signed   By: Sebastian AcheAllen  Grady   On: 04/15/2014 07:44   Code Status: Full Family Communication: Pt and daughter at bedside Disposition Plan: Admit for further evaluation     Review of Systems:  Constitutional: + for fatigue.  Negative for fever, chills and diaphoresis.  HENT: Negative for hearing loss, ear pain, nosebleeds, congestion, sore throat, neck pain, tinnitus and ear discharge.   Eyes: + for blurred and double vision. Negative for photophobia, pain, discharge and redness.  Respiratory: Negative for cough, hemoptysis, sputum production, shortness of breath, wheezing and  stridor.   Cardiovascular: Negative for chest pain, palpitations, orthopnea, claudication and leg swelling.  Gastrointestinal: Negative for nausea, vomiting and abdominal pain. Negative for heartburn, constipation, blood in stool and melena.  Genitourinary: Negative for dysuria, urgency, frequency, hematuria and flank pain.  Musculoskeletal: Negative for myalgias, back pain, joint pain and falls.  Skin: Negative for itching and rash.  Neurological: + for dizziness and possibly speech changes Negative for weakness. Negative for tingling, tremors, sensory change, focal weakness, loss of consciousness and headaches.  Endo/Heme/Allergies: Negative for environmental allergies and polydipsia. Does not bruise/bleed easily.    Confirmed with patient Past Medical History  Diagnosis Date  . Thyroid disease   . Hyperlipidemia   . Hypothyroidism   . SVT (supraventricular tachycardia)   . Shingles   . Hypertension   . Stroke 04/15/2014  . Depression   . Anxiety     Past Surgical History  Procedure Laterality Date  . Tubal ligation      Social History:  reports that she has never smoked. She has never used smokeless tobacco. She reports that she drinks alcohol. She reports that she does not use illicit drugs.  Reports ~ 1 bottle of wine a night.   Allergies  Allergen Reactions  . Amoxicillin Rash    Family History  Problem Relation Age of Onset  . Coronary artery disease Mother   . Coronary artery disease Brother   . Heart attack Brother     Prior to Admission medications   Medication Sig Start Date End Date Taking? Authorizing Provider  ALPRAZolam Prudy Feeler(XANAX) 0.5  MG tablet Take 0.5 mg by mouth at bedtime as needed for anxiety.   Yes Historical Provider, MD  atorvastatin (LIPITOR) 40 MG tablet Take 40 mg by mouth daily.   Yes Historical Provider, MD  DULoxetine (CYMBALTA) 60 MG capsule Take 60 mg by mouth daily.   Yes Historical Provider, MD  levothyroxine (SYNTHROID, LEVOTHROID) 112 MCG  tablet Take 224 mcg by mouth daily.    Yes Historical Provider, MD  lisinopril-hydrochlorothiazide (PRINZIDE,ZESTORETIC) 20-25 MG per tablet Take 1 tablet by mouth daily.   Yes Historical Provider, MD  metoprolol succinate (TOPROL-XL) 100 MG 24 hr tablet Take 100 mg by mouth daily. Take with or immediately following a meal.   Yes Historical Provider, MD    Physical Exam: Filed Vitals:   04/15/14 0900 04/15/14 0930 04/15/14 0954 04/15/14 1020  BP: 124/69 116/72 116/72 126/66  Pulse: 72 73  74  Temp:    97.9 F (36.6 C)  TempSrc:    Oral  Resp: 19 17  16   Height:    5\' 3"  (1.6 m)  Weight:    176 lb 2.4 oz (79.9 kg)  SpO2: 94% 96%  96%    Physical Exam  Constitutional: Appears well-developed and well-nourished. No distress. Wearing glassess HENT: Normocephalic. Oropharynx is clear and moist.  Eyes: Conjunctivae normal and no scleral icterus.  CVS: RRR, S1/S2 +, no murmurs Pulmonary: Effort and breath sounds normal, no stridor, rhonchi, wheezes, rales.  Abdominal: Soft. BS +,  no distension, tenderness, rebound or guarding.  Musculoskeletal: no edema, warm and dry Neuro: Alert. Normal muscle tone and coordination. No cranial nerve deficit. Strength appears equal in bilateral lower and upper extremities.  RAM normal.  Skin: Skin is warm and dry. No rash noted. Not diaphoretic. No erythema. No pallor.  Psychiatric: Normal mood and affect. Behavior, judgment, thought content normal.   Labs on Admission:  Basic Metabolic Panel:  Recent Labs Lab 04/14/14 1853 04/15/14 1324  NA 139  --   K 3.6*  --   CL 97  --   CO2 30  --   GLUCOSE 100*  --   BUN 20  --   CREATININE 0.74 0.83  CALCIUM 10.2  --    Liver Function Tests:  Recent Labs Lab 04/14/14 1853  AST 26  ALT 35  ALKPHOS 106  BILITOT 0.6  PROT 7.8  ALBUMIN 4.2   CBC:  Recent Labs Lab 04/14/14 1853 04/15/14 1324  WBC 10.2 9.5  NEUTROABS 5.9  --   HGB 15.6* 14.4  HCT 44.5 41.9  MCV 88.8 88.6  PLT 296 265     EKG: Normal sinus rhythm, no ST/T wave changes  Debe Coder, MD  [facility] Pager [phone]  If 7PM-7AM, please contact night-coverage [email or web address] Password [facility] 04/15/2014, 3:44 PM

## 2014-04-15 NOTE — Evaluation (Addendum)
Occupational Therapy Evaluation Patient Details Name: [name] Wagner MRN: [id number] DOB: 1956-03-06 Today's Date: 04/15/2014    History of Present Illness 58 y.o. with MRI revealing left thalamic infarct.   Clinical Impression   Pt admitted with above. Pt with apparent vision deficit-to be further assessed/treated tomorrow. Educated on signs/symptoms of stroke and importance of getting help right away. Recommended not to drive at this time as pt reports diplopia coming and going at times. Discussed d/c recommendation.    Follow Up Recommendations  Outpatient OT    Equipment Recommendations  None recommended by OT    Recommendations for Other Services       Precautions / Restrictions Restrictions Weight Bearing Restrictions: No      Mobility Bed Mobility Overal bed mobility: Independent                Transfers Overall transfer level: Independent                    Balance Overall balance assessment: No apparent balance deficits (not formally assessed)                                          ADL Overall ADL's : Independent                     Lower Body Dressing: Independent;Sit to/from stand   Toilet Transfer: Independent;Ambulation (bed)           Functional mobility during ADLs: Independent   Comments: Educated on BE FAST stroke education. Recommended avoiding canned foods/increased sodium. Pt asking about prevention and OT explaining that MD should talk to pt about this and medications.        Vision  Pt wears glasses all the time  Pt reports diplopia at times (did not occur in session) and blurry vision     Tracking/Visual Pursuits: Other (comment) (lost pen at times) Saccades: Other (comment) (seemed difficult and was slow) Convergence: Impaired (comment)- Right eye drifting out; sometimes eyes did not seem to converge; pt closing one eye to read -OT covered one eye of pt and had pt look at OT's nose and  then uncovered other eye and it had drifted out to side (both eyes did this)  Comments: OT told pt activities to be doing to work on Customer service managervisual tracking/saccades/convergence.          Perception     Praxis      Pertinent Vitals/Pain Pain Assessment: Faces Faces Pain Scale: No hurt     Hand Dominance     Extremity/Trunk Assessment Upper Extremity Assessment Upper Extremity Assessment: Overall WFL for tasks assessed   Lower Extremity Assessment Lower Extremity Assessment: Overall WFL for tasks assessed   Cervical / Trunk Assessment Cervical / Trunk Assessment: Normal   Communication Communication Communication: No difficulties   Cognition Arousal/Alertness: Awake/alert Behavior During Therapy: WFL for tasks assessed/performed Overall Cognitive Status:  (unsure if this is baseline) Area of Impairment: Memory     Memory: Decreased short-term memory             General Comments       Exercises       Shoulder Instructions      Home Living Family/patient expects to be discharged to:: Private residence Living Arrangements: Alone Available Help at Discharge: Family Type of Home: House Home Access: Stairs to enter Entergy CorporationEntrance Stairs-Number of  Steps: 4 Entrance Stairs-Rails: None Home Layout: One level     Bathroom Shower/Tub: Producer, television/film/videoWalk-in shower   Bathroom Toilet: Standard                Prior Functioning/Environment Level of Independence: Independent             OT Diagnosis: Disturbance of vision   OT Problem List: Decreased cognition;Impaired vision/perception   OT Treatment/Interventions: Therapeutic exercise;Visual/perceptual remediation/compensation;Patient/family education;Cognitive remediation/compensation;Therapeutic activities    OT Goals(Current goals can be found in the care plan section) Acute Rehab OT Goals Patient Stated Goal: not stated OT Goal Formulation: With patient Time For Goal Achievement: 04/22/14 Potential to Achieve Goals:  Good ADL Goals Additional ADL Goal #1: Pt will participate in further vision assessment and be independent with vision exercises.  OT Frequency: Min 2X/week   Barriers to D/C:            Co-evaluation              End of Session    Activity Tolerance: Patient tolerated treatment well Patient left: in bed;with call bell/phone within reach   Time: [id number] OT Time Calculation (min): 34 min Charges:  OT General Charges $OT Visit: 1 Procedure OT Evaluation $Initial OT Evaluation Tier I: 1 Procedure OT Treatments $Therapeutic Activity: 8-22 mins G-CodesEarlie Raveling:    Syris Brookens L OTR/L [phone] 04/15/2014, 5:31 PM

## 2014-04-15 NOTE — Progress Notes (Signed)
*  PRELIMINARY RESULTS* Vascular Ultrasound Carotid Duplex (Doppler) has been completed.  Preliminary findings: Bilateral:  1-39% ICA stenosis.  Vertebral artery flow is antegrade.     Farrel DemarkJill Eunice, RDMS, RVT  04/15/2014, 3:22 PM

## 2014-04-15 NOTE — ED Notes (Signed)
Neurology at bedside.

## 2014-04-15 NOTE — Consult Note (Signed)
Referring Physician: Lynelle Doctor    Chief Complaint: diplopia and stroke  HPI:                                                                                                                                         [name] Wagner is an 58 y.o. female with known HTN and hyperlipidemia.  Patient was doing fine until Wednesday. On Wednesday she had a sudden onset of dizziness and horizontal diplopia. She noted if she closed one eye the diplopia resolved. She continued to go to work and drive but states she had to close one eye to see correctly.  She also noted  She would tend to lean tot he riight when she was walking. Due to her symptoms not resolving over a few days she finally came to ED to have her symptoms evaluated.   Date last known well: Date: 04/10/2014 Time last known well: 2000 tPA Given: No: out of window  Past Medical History  Diagnosis Date  . Thyroid disease   . Hyperlipidemia   . Hypothyroidism   . SVT (supraventricular tachycardia)   . Shingles     History reviewed. No pertinent past surgical history.  Family History  Problem Relation Age of Onset  . Coronary artery disease Mother   . Coronary artery disease Brother   . Heart attack Brother    Social History:  reports that she has never smoked. She does not have any smokeless tobacco history on file. She reports that she does not drink alcohol or use illicit drugs.  Allergies:  Allergies  Allergen Reactions  . Amoxicillin Rash    Medications:                                                                                                                           Prior to Admission:  (Not in a hospital admission) Scheduled: . atorvastatin  40 mg Oral q1800  . DULoxetine  60 mg Oral Daily  . hydrochlorothiazide  25 mg Oral Daily  . levothyroxine  224 mcg Oral QAC breakfast  . lisinopril  20 mg Oral Daily  . metoprolol succinate  100 mg Oral Daily    ROS:  History obtained from the patient  General ROS: negative for - chills, fatigue, fever, night sweats, weight gain or weight loss Psychological ROS: negative for - behavioral disorder, hallucinations, memory difficulties, mood swings or suicidal ideation Ophthalmic ROS: negative for - blurry vision, double vision, eye pain or loss of vision ENT ROS: negative for - epistaxis, nasal discharge, oral lesions, sore throat, tinnitus or vertigo Allergy and Immunology ROS: negative for - hives or itchy/watery eyes Hematological and Lymphatic ROS: negative for - bleeding problems, bruising or swollen lymph nodes Endocrine ROS: negative for - galactorrhea, hair pattern changes, polydipsia/polyuria or temperature intolerance Respiratory ROS: negative for - cough, hemoptysis, shortness of breath or wheezing Cardiovascular ROS: negative for - chest pain, dyspnea on exertion, edema or irregular heartbeat Gastrointestinal ROS: negative for - abdominal pain, diarrhea, hematemesis, nausea/vomiting or stool incontinence Genito-Urinary ROS: negative for - dysuria, hematuria, incontinence or urinary frequency/urgency Musculoskeletal ROS: negative for - joint swelling or muscular weakness Neurological ROS: as noted in HPI Dermatological ROS: negative for rash and skin lesion changes  Neurologic Examination:                                                                                                      Blood pressure 124/69, pulse 72, temperature 98 F (36.7 C), temperature source Oral, resp. rate 19, height 5\' 2"  (1.575 m), weight 80.287 kg (177 lb), SpO2 94 %.   Physical Exam  Constitutional: He appears well-developed and well-nourished.  Psych: Affect appropriate to situation Eyes: No scleral injection HENT: No OP obstrucion Head: Normocephalic.  Cardiovascular: Normal rate and regular rhythm.  Respiratory: Effort normal and  breath sounds normal.  GI: Soft. Bowel sounds are normal. No distension. There is no tenderness.  Skin: WDI  Neuro Exam: Mental Status: Alert, oriented, thought content appropriate.  Speech slurred without evidence of aphasia.  Able to follow 3 step commands without difficulty. Cranial Nerves: II: Discs flat bilaterally; Visual fields grossly normal--at far gaze complains of diplopia side by side. Pupils equal, round, reactive to light and accommodation III,IV, VI: ptosis not present, extra-ocular motions intact bilaterally V,VII: smile symmetric, facial light touch sensation normal bilaterally VIII: hearing normal bilaterally IX,X: gag reflex present XI: bilateral shoulder shrug XII: midline tongue extension without atrophy or fasciculations  Motor: Right : Upper extremity   5/5    Left:     Upper extremity   5/5  Lower extremity   5/5     Lower extremity   5/5 Tone and bulk:normal tone throughout; no atrophy noted Sensory: Pinprick and light touch intact throughout, bilaterally Deep Tendon Reflexes:  Right: Upper Extremity   Left: Upper extremity   biceps (C-5 to C-6) 2/4   biceps (C-5 to C-6) 2/4 tricep (C7) 2/4    triceps (C7) 2/4 Brachioradialis (C6) 2/4  Brachioradialis (C6) 2/4  Lower Extremity Lower Extremity  quadriceps (L-2 to L-4) 2/4   quadriceps (L-2 to L-4) 2/4 Achilles (S1) 2/4   Achilles (S1) 2/4  Plantars: Right: downgoing   Left: downgoing Cerebellar: normal finger-to-nose,  normal heel-to-shin test Gait: not tested.  CV: pulses palpable throughout    Lab Results: Basic Metabolic Panel:  Recent Labs Lab 04/14/14 1853  NA 139  K 3.6*  CL 97  CO2 30  GLUCOSE 100*  BUN 20  CREATININE 0.74  CALCIUM 10.2    Liver Function Tests:  Recent Labs Lab 04/14/14 1853  AST 26  ALT 35  ALKPHOS 106  BILITOT 0.6  PROT 7.8  ALBUMIN 4.2   No results for input(s): LIPASE, AMYLASE in the last 168 hours. No results for input(s): AMMONIA in the last 168  hours.  CBC:  Recent Labs Lab 04/14/14 1853  WBC 10.2  NEUTROABS 5.9  HGB 15.6*  HCT 44.5  MCV 88.8  PLT 296    Cardiac Enzymes: No results for input(s): CKTOTAL, CKMB, CKMBINDEX, TROPONINI in the last 168 hours.  Lipid Panel: No results for input(s): CHOL, TRIG, HDL, CHOLHDL, VLDL, LDLCALC in the last 168 hours.  CBG: No results for input(s): GLUCAP in the last 168 hours.  Microbiology: Results for orders placed or performed during the hospital encounter of 05/09/10  Pathologist smear review     Status: None   Collection Time: 05/09/10  3:00 PM  Result Value Ref Range Status   Tech Review   Final    MILD        Absolute neutrophilia with mild left shift in maturation. Favor a reactive process, Clinical correlation is recommended.        Absolute lymphocytosis. Suggest immunophenotyping if a new, persistent finding.        Reviewed by Coralyn Pearobert M. Gay, M.D. 05/12/10    Coagulation Studies:  Recent Labs  04/14/14 1853  LABPROT 13.9  INR 1.06    Imaging: Mr Brain Wo Contrast  04/15/2014   CLINICAL DATA:  Diplopia.  EXAM: MRI HEAD WITHOUT CONTRAST  TECHNIQUE: Multiplanar, multiecho pulse sequences of the brain and surrounding structures were obtained without intravenous contrast.  COMPARISON:  None.  FINDINGS: There are small foci of acute infarction in the left thalamus slightly extending into the left cerebral peduncle. There is no evidence of intracranial hemorrhage, mass, midline shift, or extra-axial fluid collection. Ventricles and sulci are within normal limits for age. Scattered, small foci of T2 hyperintensity in the subcortical and deep cerebral white matter bilaterally are nonspecific but compatible with minimal chronic small vessel ischemic disease.  Orbits are unremarkable. There is mild right anterior ethmoid air cell mucosal thickening. Small left mastoid effusion is noted. Major intracranial vascular flow voids are preserved.  IMPRESSION: 1. Acute left  thalamic infarct. 2. Minimal chronic small vessel ischemic disease.   Electronically Signed   By: Sebastian AcheAllen  Grady   On: 04/15/2014 07:44       Assessment and plan discussed with with attending physician and they are in agreement.    Felicie MornDavid Smith PA-C [facility] [phone]  04/15/2014, 9:52 AM   Assessment: 58 y.o. female with 5 day history of horizontal diplopia which resolves when covering one eye.  Exam is non-focal other than diplopia when looking distally. MRI reviewed and shows a acute left thalamic infarct.   Stroke Risk Factors - hyperlipidemia and hypertension  1. HgbA1c, fasting lipid panel 2.  MRA  of the brain without contrast and MRA neck with contrast.  3. PT consult, OT consult, Speech consult 4. Echocardiogram 5. Carotid dopplers 6. Prophylactic therapy-Antiplatelet med: Aspirin - dose 81 mg daily 7. Risk factor modification 8. Telemetry monitoring 9. Frequent neuro checks  [name] Choeter Jaelen Soth, DO [facility] [phone]  If 7pm-  7am, please page neurology on call as listed in [place].

## 2014-04-15 NOTE — ED Notes (Signed)
Neuro pa in to see pt

## 2014-04-15 NOTE — Progress Notes (Signed)
  Echocardiogram 2D Echocardiogram has been performed.  Cathie BeamsGREGORY, Gabrial Poppell 04/15/2014, 3:48 PM

## 2014-04-15 NOTE — ED Notes (Signed)
Pa at bedside to discuss mri results with pt

## 2014-04-15 NOTE — Evaluation (Signed)
Clinical/Bedside Swallow Evaluation Patient Details  Name: [name] Wagner MRN: [id number] Date of Birth: [birth year]  Today's Date: 04/15/2014 Time: 1450-1459 SLP Time Calculation (min) (ACUTE ONLY): 9 min  Past Medical History:  Past Medical History  Diagnosis Date  . Thyroid disease   . Hyperlipidemia   . Hypothyroidism   . SVT (supraventricular tachycardia)   . Shingles   . Hypertension   . Stroke 04/15/2014  . Depression   . Anxiety    Past Surgical History:  Past Surgical History  Procedure Laterality Date  . Tubal ligation     HPI:  58 y.o. female with known HTN, thyroid disease and hyperlipidemia admitted with sudden onset of dizziness and horizontal diplopia. MRI revealed Acute left thalamic infarct.   Assessment / Plan / Recommendation Clinical Impression  Swallow assessment not indicative of dysfunction over various trials and consistencies observed.  Pt. denies prior or current swallow difficulty.  Cough strong, oral-motor movements WFL's. Initially minimal dysarthria after wakening which cleared and appeared to be due to sleeping prior.  Recommend continue regular texture and thin liquids, pills whole in applesauce, no further ST needed.       Aspiration Risk  Mild    Diet Recommendation Regular;Thin liquid   Liquid Administration via: Cup;Straw Medication Administration: Whole meds with liquid Supervision: Patient able to self feed Compensations: Slow rate;Small sips/bites Postural Changes and/or Swallow Maneuvers: Out of bed for meals;Seated upright 90 degrees    Other  Recommendations Oral Care Recommendations: Oral care BID   Follow Up Recommendations  None    Frequency and Duration        Pertinent Vitals/Pain No pain      Swallow Study       Oral/Motor/Sensory Function Overall Oral Motor/Sensory Function: Appears within functional limits for tasks assessed   Ice Chips Ice chips: Not tested   Thin Liquid Thin Liquid: Within functional  limits Presentation: Cup;Straw    Nectar Thick Nectar Thick Liquid: Not tested   Honey Thick Honey Thick Liquid: Not tested   Puree Puree: Within functional limits   Solid   GO    Solid: Within functional limits       Royce MacadamiaLitaker, Emmogene Simson Willis 04/15/2014,3:11 PM  Breck CoonsLisa Willis AllenwoodLitaker M.Ed [employer] Pager [phone]

## 2014-04-15 NOTE — ED Notes (Signed)
Patient has been transported to floor. Unable to move her off floor at this time due to 5000 staff in chart

## 2014-04-16 LAB — LIPID PANEL
Cholesterol: 191 mg/dL (ref 0–200)
HDL: 40 mg/dL (ref >39)
LDL Cholesterol: 118 mg/dL — ABNORMAL HIGH (ref 0–99)
Total CHOL/HDL Ratio: 4.8 RATIO
Triglycerides: 163 mg/dL — ABNORMAL HIGH (ref <150)
VLDL: 33 mg/dL (ref 0–40)

## 2014-04-16 LAB — HEMOGLOBIN A1C
Hgb A1c MFr Bld: 5.7 % — ABNORMAL HIGH (ref <5.7)
Mean Plasma Glucose: 117 mg/dL — ABNORMAL HIGH (ref <117)

## 2014-04-16 NOTE — Progress Notes (Signed)
PROGRESS NOTE  [name] Wagner WUJ:[id number] DOB: [birth year] DOA: 04/14/2014 PCP: Sissy Hoff, MD  HPI: 58yo woman with PMH of HTN, hypothyroidism, h/o SVT and HLD who presented due to above. [name] reports that she began having symptoms on Wednesday, particularly dizziness and blurred vision and double vision, found to have a CVA  Subjective/ 24 H Interval events No complaints this morning, feels about the same  Assessment/Plan: Principal Problem:   CVA (cerebral vascular accident) Active Problems:   CVA (cerebral infarction)   HTN (hypertension)   HLD (hyperlipidemia)   Hypothyroidism   Stroke   Diplopia   CVA; left thalamic stroke - MRI and MRA positive for left thalamic CVA - Allow permissive HTN - Neurology is following along - TTE, CD pending - PT/OT/SLP - she passed bedside swallow - Good risk factor management - Aspirin, statin  HTN (hypertension) - Allow permissive HTN, hold BP medications including metoprolol, lisinopril-hctz - She is normotensive now, had BP medications this morning.    HLD (hyperlipidemia) - Continue home atorvastatin   Hypothyroidism - Continue home synthroid  ETOH use - Reports 1 bottle of wine a night - CIWA protocol as she will not get any ETOH here. She does not report any history of withdrawal.   Depression/anxiety - Continue home cymbalta and xanax   Diet: heart Fluids: none  DVT Prophylaxis: heparin  Code Status: Full Family Communication: d/w daughter bdside  Disposition Plan: home when ready   Consultants:  Neurology   Procedures:  2D echo  Carotid doppler   Antibiotics None    Studies  Mr Shirlee Latch NF Contrast  04/15/2014   ADDENDUM REPORT: 04/15/2014 14:18  ADDENDUM: Study discussed by telephone with Neurohospitalist Dr. Elspeth Cho On 04/15/2014 at 1414 hrs.   Electronically Signed   By: Augusto Gamble M.D.   On: 04/15/2014 14:18   04/15/2014   CLINICAL DATA:  58 year old female  with small acute left thalamic and midbrain infarct detected on MRI for evaluation of diplopia. Initial encounter.  EXAM: MRA NECK WITHOUT AND WITH CONTRAST  MRA HEAD WITHOUT CONTRAST  TECHNIQUE: Multiplanar and multiecho pulse sequences of the neck were obtained without and with intravenous contrast. Angiographic images of the neck were obtained using MRA technique without and with intravenous contast.; Angiographic images of the Circle of Willis were obtained using MRA technique without intravenous contrast.  CONTRAST:  17mL MULTIHANCE GADOBENATE DIMEGLUMINE 529 MG/ML IV SOLN  COMPARISON:  Brain MRI without contrast 0644 hr the same day.  FINDINGS: MRA NECK FINDINGS  Pre contrast time-of-flight neck MRA images. Antegrade flow in both carotid and vertebral arteries in the neck. Fairly codominant vertebral arteries.  Post-contrast MRA images. Three vessel arch configuration with no great vessel origin stenosis.  Moderate irregularity at the right carotid bifurcation (series 62130, image 15) but with no hemodynamically significant stenosis at the right ICA origin or bulb. Negative cervical right ICA otherwise.  Minimal to mild irregularity at the left carotid bifurcation and bulb. No cervical left ICA stenosis.  Normal left vertebral artery origin, the right is not well visualized. Slight dominance of the left vertebral artery. No vertebral artery stenosis elsewhere in the neck. Negative distal vertebral arteries, both PICA origins are patent.  MRA HEAD FINDINGS  Antegrade flow in the posterior circulation with fairly codominant distal vertebral arteries. Normal PICA origins. Normal vertebrobasilar junction. No basilar artery irregularity or stenosis. SCA origins are within normal limits.  There is a fetal type right PCA origin, however the left  PCA origin is conventional (there is a small left posterior communicating artery, and there is a short 3 mm segment of high-grade stenosis or occlusion in the left P1. See  series 3, image 54.  Otherwise the bilateral PCA branches are within normal limits.  Antegrade flow in both ICA siphons. No siphon stenosis. Ophthalmic and posterior communicating artery origins are within normal limits. Normal carotid termini, MCA and ACA origins. Anterior communicating artery is diminutive or absent. Visualized bilateral ACA branches are within normal limits. Visualized bilateral MCA branches are within normal limits.  IMPRESSION: 1. Left PCA P1 short 3 mm segment of occlusion or high-grade stenosis corresponding to the acute left midbrain and thalamus striate artery territory infarct. 2. Otherwise negative intracranial and extracranial posterior circulation. 3. Right greater than left carotid bifurcation atherosclerosis, but no anterior circulation hemodynamically significant stenosis.  Electronically Signed: By: Augusto GambleLee  Hall M.D. On: 04/15/2014 13:54   [name]  04/15/2014   ADDENDUM REPORT: 04/15/2014 14:18  ADDENDUM: Study discussed by telephone with Neurohospitalist Dr. Elspeth ChoPeter Sumner On 04/15/2014 at 1414 hrs.   Electronically Signed   By: Augusto GambleLee  Hall M.D.   On: 04/15/2014 14:18   04/15/2014   CLINICAL DATA:  58 year old female with small acute left thalamic and midbrain infarct detected on MRI for evaluation of diplopia. Initial encounter.  EXAM: MRA NECK WITHOUT AND WITH CONTRAST  MRA HEAD WITHOUT CONTRAST  TECHNIQUE: Multiplanar and multiecho pulse sequences of the neck were obtained without and with intravenous contrast. Angiographic images of the neck were obtained using MRA technique without and with intravenous contast.; Angiographic images of the Circle of Willis were obtained using MRA technique without intravenous contrast.  CONTRAST:  17mL MULTIHANCE GADOBENATE DIMEGLUMINE 529 MG/ML IV SOLN  COMPARISON:  Brain MRI without contrast 0644 hr the same day.  FINDINGS: MRA NECK FINDINGS  Pre contrast time-of-flight neck MRA images. Antegrade flow in both carotid  and vertebral arteries in the neck. Fairly codominant vertebral arteries.  Post-contrast MRA images. Three vessel arch configuration with no great vessel origin stenosis.  Moderate irregularity at the right carotid bifurcation (series [id number], image 15) but with no hemodynamically significant stenosis at the right ICA origin or bulb. Negative cervical right ICA otherwise.  Minimal to mild irregularity at the left carotid bifurcation and bulb. No cervical left ICA stenosis.  Normal left vertebral artery origin, the right is not well visualized. Slight dominance of the left vertebral artery. No vertebral artery stenosis elsewhere in the neck. Negative distal vertebral arteries, both PICA origins are patent.  MRA HEAD FINDINGS  Antegrade flow in the posterior circulation with fairly codominant distal vertebral arteries. Normal PICA origins. Normal vertebrobasilar junction. No basilar artery irregularity or stenosis. SCA origins are within normal limits.  There is a fetal type right PCA origin, however the left PCA origin is conventional (there is a small left posterior communicating artery, and there is a short 3 mm segment of high-grade stenosis or occlusion in the left P1. See series 3, image 54.  Otherwise the bilateral PCA branches are within normal limits.  Antegrade flow in both ICA siphons. No siphon stenosis. Ophthalmic and posterior communicating artery origins are within normal limits. Normal carotid termini, MCA and ACA origins. Anterior communicating artery is diminutive or absent. Visualized bilateral ACA branches are within normal limits. Visualized bilateral MCA branches are within normal limits.  IMPRESSION: 1. Left PCA P1 short 3 mm segment of occlusion or high-grade stenosis corresponding to the acute left  midbrain and thalamus striate artery territory infarct. 2. Otherwise negative intracranial and extracranial posterior circulation. 3. Right greater than left carotid bifurcation atherosclerosis, but  no anterior circulation hemodynamically significant stenosis.  Electronically Signed: By: Augusto GambleLee  Hall M.D. On: 04/15/2014 13:54   Mr Brain Wo Contrast  04/15/2014   CLINICAL DATA:  Diplopia.  EXAM: MRI HEAD WITHOUT CONTRAST  TECHNIQUE: Multiplanar, multiecho pulse sequences of the brain and surrounding structures were obtained without intravenous contrast.  COMPARISON:  None.  FINDINGS: There are small foci of acute infarction in the left thalamus slightly extending into the left cerebral peduncle. There is no evidence of intracranial hemorrhage, mass, midline shift, or extra-axial fluid collection. Ventricles and sulci are within normal limits for age. Scattered, small foci of T2 hyperintensity in the subcortical and deep cerebral white matter bilaterally are nonspecific but compatible with minimal chronic small vessel ischemic disease.  Orbits are unremarkable. There is mild right anterior ethmoid air cell mucosal thickening. Small left mastoid effusion is noted. Major intracranial vascular flow voids are preserved.  IMPRESSION: 1. Acute left thalamic infarct. 2. Minimal chronic small vessel ischemic disease.   Electronically Signed   By: Sebastian AcheAllen  Grady   On: 04/15/2014 07:44     Objective  Filed Vitals:   04/15/14 2019 04/16/14 0149 04/16/14 0529 04/16/14 1238  BP: 138/90 149/86 133/77 134/80  Pulse: 75 63 73 76  Temp: 98.3 F (36.8 C) 97.6 F (36.4 C) 97.6 F (36.4 C) 97.6 F (36.4 C)  TempSrc: Oral Oral Oral Oral  Resp: 18 18 14 16   Height:      Weight:      SpO2: 93% 93% 97% 98%    Intake/Output Summary (Last 24 hours) at 04/16/14 1246 Last data filed at 04/15/14 1853  Gross per 24 hour  Intake    240 ml  Output      0 ml  Net    240 ml   Filed Weights   04/14/14 1810 04/15/14 1020  Weight: 80.287 kg (177 lb) 79.9 kg (176 lb 2.4 oz)    Exam:  General:  NAD  Cardiovascular: RRR  Respiratory: CTA biL  Abdomen: soft, non tender  MSK: no edema  Neuro: non focal  Data  Reviewed: Basic Metabolic Panel:  Recent Labs Lab 04/14/14 1853 04/15/14 1324  NA 139  --   K 3.6*  --   CL 97  --   CO2 30  --   GLUCOSE 100*  --   BUN 20  --   CREATININE 0.74 0.83  CALCIUM 10.2  --    Liver Function Tests:  Recent Labs Lab 04/14/14 1853  AST 26  ALT 35  ALKPHOS 106  BILITOT 0.6  PROT 7.8  ALBUMIN 4.2   CBC:  Recent Labs Lab 04/14/14 1853 04/15/14 1324  WBC 10.2 9.5  NEUTROABS 5.9  --   HGB 15.6* 14.4  HCT 44.5 41.9  MCV 88.8 88.6  PLT 296 265    Scheduled Meds: . aspirin  81 mg Oral Daily  . atorvastatin  40 mg Oral q1800  . docusate sodium  100 mg Oral BID  . DULoxetine  60 mg Oral Daily  . folic acid  1 mg Oral Daily  . heparin  5,000 Units Subcutaneous 3 times per day  . levothyroxine  224 mcg Oral QAC breakfast  . multivitamin with minerals  1 tablet Oral Daily  . sodium chloride  3 mL Intravenous Q12H  . sodium chloride  3 mL  Intravenous Q12H  . thiamine  100 mg Oral Daily   Or  . thiamine  100 mg Intravenous Daily   Continuous Infusions:   Time spent: 25 minutes  Pamella Pert, MD [facility] Pager [phone]. If 7 PM - 7 AM, please contact night-coverage at [email or web address], password [email or web address] 04/16/2014, 12:46 PM  LOS: 2 days

## 2014-04-16 NOTE — Progress Notes (Signed)
Occupational Therapy Treatment Patient Details Name: [name] Wagner MRN: [id number] DOB: Jun 12, 1955 Today's Date: 04/16/2014    History of present illness 58 y.o. with MRI revealing left thalamic infarct.   OT comments  Pt not c/o diplopia at this time. Pt continues to c/o blurred vision. Noted poor convergence with eyes. R eye only moves medically if L eye occluded. Recommend pt follow up with neuro outpt OT and refrain from driving at this time. Pt safe to D/C home with intermittent S, which family can provide, when medically stable. Pt given written exercises on visual saccades, pursuits and tracking. Will continue to follow.  Follow Up Recommendations  Outpatient OT;Supervision - Intermittent    Equipment Recommendations  None recommended by OT    Recommendations for Other Services      Precautions / Restrictions Precautions Precautions: None Restrictions Weight Bearing Restrictions: No       Mobility Bed Mobility Overal bed mobility: Independent                Transfers Overall transfer level: Independent                    Balance Overall balance assessment: No apparent balance deficits (not formally assessed)                                 ADL Overall ADL's : Independent                                     Functional mobility during ADLs: Independent General ADL Comments: discussed home safety and recommendation for intermittent S      Vision Eye Alignment: Impaired (comment)   Ocular Range of Motion: Restricted on the left Tracking/Visual Pursuits: Decreased smoothness of horizontal tracking;Right eye does not track medially;Other (comment) (difficulty with r eye medial movement during convergence. R ) Saccades: Decreased speed of saccadic movement;Additional eye shifts occurred during testing Convergence: Impaired (comment) (r eye does not converge) Diplopia Assessment: Other (comment) (pt c/o intermittent  diplpia. appears to have resolved)       Perception     Praxis      Cognition   Behavior During Therapy: [facility] for tasks assessed/performed Overall Cognitive Status: Impaired/Different from baseline Area of Impairment: Safety/judgement;Memory     Memory: Decreased short-term memory    Safety/Judgement: Decreased awareness of safety (pt continued to drive with diplopia)          Extremity/Trunk Assessment               Exercises Other Exercises Other Exercises: visual exercises - saccades; tracking;convergence Other Exercises: [email or web address] website   Shoulder Instructions       General Comments      Pertinent Vitals/ Pain       Pain Assessment: No/denies pain  Home Living                                          Prior Functioning/Environment              Frequency Min 2X/week     Progress Toward Goals  OT Goals(current goals can now be found in the care plan section)  Progress towards OT goals: Progressing toward goals  Acute Rehab OT Goals Patient Stated  Goal: to be able to see better OT Goal Formulation: With patient Time For Goal Achievement: 04/22/14 Potential to Achieve Goals: Good ADL Goals Additional ADL Goal #1: Pt will participate in further vision assessment and be independent with vision exercises.  Plan Discharge plan remains appropriate    Co-evaluation                 End of Session     Activity Tolerance Patient tolerated treatment well   Patient Left in bed;with call bell/phone within reach;with family/visitor present   Nurse Communication Mobility status;Other (comment) (need for neuro outpt OT)        Time: [id number] OT Time Calculation (min): 45 min  Charges: OT General Charges $OT Visit: 1 Procedure OT Treatments $Therapeutic Activity: 38-52 mins  Jeramyah Goodpasture,HILLARY 04/16/2014, 5:06 PM   [facility] Matt Delpizzo, OTR/L  [phone] 04/16/2014

## 2014-04-16 NOTE — Progress Notes (Addendum)
    [facility] has been requested to perform a transesophageal echocardiogram with LINQ placement on 04/17/14 for CVA at 3pm .  After careful review of history and examination, the risks and benefits of transesophageal echocardiogram have been explained including risks of esophageal damage, perforation (1:10,000 risk), bleeding, pharyngeal hematoma as well as other potential complications associated with conscious sedation including aspiration, arrhythmia, respiratory failure and death. Alternatives to treatment were discussed, questions were answered. Patient is willing to proceed.   Janetta HoraHOMPSON, Sandrina Heaton R, PA-C 04/16/2014 2:58 PM

## 2014-04-16 NOTE — Consult Note (Signed)
ELECTROPHYSIOLOGY CONSULT NOTE  Patient ID: [name] Wagner MRN: [id number], DOB/AGE: August 02, 1955   Admit date: 04/14/2014 Date of Consult: 04/16/2014  Primary Physician: Sissy HoffSWAYNE,DAVID W, MD [facility] Reason for Consultation: Cryptogenic stroke; recommendations regarding Implantable Loop Recorder  History of Present Illness [name] Wagner was admitted on 04/14/2014 with dizziness and horizontal diplopia.  Imaging demonstrated acute left thalamic infarct.  She has undergone workup for stroke including echocardiogram and carotid dopplers.  The patient has been monitored on telemetry which has demonstrated sinus rhythm with no arrhythmias.  Inpatient stroke work-up is to be completed with a TEE.   Echocardiogram this admission demonstrated EF 60-65%, no RWMA, grade 1 diastolic dysfunction, LA 27.  Lab work is reviewed.   Prior to admission, the patient denies chest pain, shortness of breath, dizziness, palpitations, or syncope.  They are recovering from their stroke with plans to return home at discharge.  EP has been asked to evaluate for placement of an implantable loop recorder to monitor for atrial fibrillation.  ROS is negative except as outlined above.    Past Medical History  Diagnosis Date  . Thyroid disease   . Hyperlipidemia   . Hypothyroidism   . SVT (supraventricular tachycardia)   . Shingles   . Hypertension   . Stroke 04/15/2014  . Depression   . Anxiety      Surgical History:  Past Surgical History  Procedure Laterality Date  . Tubal ligation       Prescriptions prior to admission  Medication Sig Dispense Refill Last Dose  . ALPRAZolam (XANAX) 0.5 MG tablet Take 0.5 mg by mouth at bedtime as needed for anxiety.   Past Month at Unknown time  . atorvastatin (LIPITOR) 40 MG tablet Take 40 mg by mouth daily.   04/14/2014 at Unknown time  . DULoxetine (CYMBALTA) 60 MG capsule Take 60 mg by mouth daily.   04/14/2014 at Unknown  time  . levothyroxine (SYNTHROID, LEVOTHROID) 112 MCG tablet Take 224 mcg by mouth daily.    04/14/2014 at Unknown time  . lisinopril-hydrochlorothiazide (PRINZIDE,ZESTORETIC) 20-25 MG per tablet Take 1 tablet by mouth daily.   04/14/2014 at Unknown time  . metoprolol succinate (TOPROL-XL) 100 MG 24 hr tablet Take 100 mg by mouth daily. Take with or immediately following a meal.   04/14/2014 at Unknown time    Inpatient Medications:  . aspirin  81 mg Oral Daily  . atorvastatin  40 mg Oral q1800  . docusate sodium  100 mg Oral BID  . DULoxetine  60 mg Oral Daily  . folic acid  1 mg Oral Daily  . heparin  5,000 Units Subcutaneous 3 times per day  . levothyroxine  224 mcg Oral QAC breakfast  . multivitamin with minerals  1 tablet Oral Daily  . sodium chloride  3 mL Intravenous Q12H  . sodium chloride  3 mL Intravenous Q12H  . thiamine  100 mg Oral Daily   Or  . thiamine  100 mg Intravenous Daily    Allergies:  Allergies  Allergen Reactions  . Amoxicillin Rash    History   Social History  . Marital Status: Divorced    Spouse Name: N/A    Number of Children: N/A  . Years of Education: N/A   Occupational History  . Not on file.   Social History Main Topics  . Smoking status: Never Smoker   . Smokeless tobacco: Never Used  . Alcohol Use: Yes     Comment:  Reports 1 bottle of wine a night.   . Drug Use: No  . Sexual Activity: Yes    Birth Control/ Protection: None   Other Topics Concern  . Not on file   Social History Narrative     Family History  Problem Relation Age of Onset  . Coronary artery disease Mother   . Coronary artery disease Brother   . Heart attack Brother      Physical Exam: Filed Vitals:   09-May-2014 2019 04/16/14 0149 04/16/14 0529 04/16/14 1238  BP: 138/90 149/86 133/77 134/80  Pulse: 75 63 73 76  Temp: 98.3 F (36.8 C) 97.6 F (36.4 C) 97.6 F (36.4 C) 97.6 F (36.4 C)  TempSrc: Oral Oral Oral Oral  Resp: 18 18 14 16   Height:        Weight:      SpO2: 93% 93% 97% 98%    GEN- The patient is well appearing, alert and oriented x 3 today.   Head- normocephalic, atraumatic Eyes-  Sclera clear, conjunctiva pink Ears- hearing intact Oropharynx- clear Neck- supple, Lungs- Clear to ausculation bilaterally, normal work of breathing Heart- Regular rate and rhythm, no murmurs, rubs or gallops, PMI not laterally displaced GI- soft, NT, ND, + BS Extremities- no clubbing, cyanosis, or edema MS- no significant deformity or atrophy Skin- no rash or lesion Psych- euthymic mood, full affect   Labs:   Lab Results  Component Value Date   WBC 9.5 2014-05-09   HGB 14.4 05-09-14   HCT 41.9 09-May-2014   MCV 88.6 May 09, 2014   PLT 265 2014-05-09    Recent Labs Lab 04/14/14 1853 May 09, 2014 1324  NA 139  --   K 3.6*  --   CL 97  --   CO2 30  --   BUN 20  --   CREATININE 0.74 0.83  CALCIUM 10.2  --   PROT 7.8  --   BILITOT 0.6  --   ALKPHOS 106  --   ALT 35  --   AST 26  --   GLUCOSE 100*  --      Radiology/Studies: Mr Shirlee Latch Wo Contrast May 09, 2014   CLINICAL DATA:  58 year old female with small acute left thalamic and midbrain infarct detected on MRI for evaluation of diplopia. Initial encounter.  EXAM: MRA NECK WITHOUT AND WITH CONTRAST  MRA HEAD WITHOUT CONTRAST  TECHNIQUE: Multiplanar and multiecho pulse sequences of the neck were obtained without and with intravenous contrast. Angiographic images of the neck were obtained using MRA technique without and with intravenous contast.; Angiographic images of the Circle of Willis were obtained using MRA technique without intravenous contrast.  CONTRAST:  17mL MULTIHANCE GADOBENATE DIMEGLUMINE 529 MG/ML IV SOLN  COMPARISON:  Brain MRI without contrast 0644 hr the same day.  FINDINGS: MRA NECK FINDINGS  Pre contrast time-of-flight neck MRA images. Antegrade flow in both carotid and vertebral arteries in the neck. Fairly codominant vertebral arteries.  Post-contrast MRA images.  Three vessel arch configuration with no great vessel origin stenosis.  Moderate irregularity at the right carotid bifurcation (series 96045, image 15) but with no hemodynamically significant stenosis at the right ICA origin or bulb. Negative cervical right ICA otherwise.  Minimal to mild irregularity at the left carotid bifurcation and bulb. No cervical left ICA stenosis.  Normal left vertebral artery origin, the right is not well visualized. Slight dominance of the left vertebral artery. No vertebral artery stenosis elsewhere in the neck. Negative distal vertebral arteries, both PICA origins are  patent.  MRA HEAD FINDINGS  Antegrade flow in the posterior circulation with fairly codominant distal vertebral arteries. Normal PICA origins. Normal vertebrobasilar junction. No basilar artery irregularity or stenosis. SCA origins are within normal limits.  There is a fetal type right PCA origin, however the left PCA origin is conventional (there is a small left posterior communicating artery, and there is a short 3 mm segment of high-grade stenosis or occlusion in the left P1. See series 3, image 54.  Otherwise the bilateral PCA branches are within normal limits.  Antegrade flow in both ICA siphons. No siphon stenosis. Ophthalmic and posterior communicating artery origins are within normal limits. Normal carotid termini, MCA and ACA origins. Anterior communicating artery is diminutive or absent. Visualized bilateral ACA branches are within normal limits. Visualized bilateral MCA branches are within normal limits.  IMPRESSION: 1. Left PCA P1 short 3 mm segment of occlusion or high-grade stenosis corresponding to the acute left midbrain and thalamus striate artery territory infarct. 2. Otherwise negative intracranial and extracranial posterior circulation. 3. Right greater than left carotid bifurcation atherosclerosis, but no anterior circulation hemodynamically significant stenosis.  Electronically Signed: By: Augusto Gamble  M.D. On: 04/15/2014 13:54   [name] 04/15/2014   CLINICAL DATA:  58 year old female with small acute left thalamic and midbrain infarct detected on MRI for evaluation of diplopia. Initial encounter.  EXAM: MRA NECK WITHOUT AND WITH CONTRAST  MRA HEAD WITHOUT CONTRAST  TECHNIQUE: Multiplanar and multiecho pulse sequences of the neck were obtained without and with intravenous contrast. Angiographic images of the neck were obtained using MRA technique without and with intravenous contast.; Angiographic images of the Circle of Willis were obtained using MRA technique without intravenous contrast.  CONTRAST:  17mL MULTIHANCE GADOBENATE DIMEGLUMINE 529 MG/ML IV SOLN  COMPARISON:  Brain MRI without contrast 0644 hr the same day.  FINDINGS: MRA NECK FINDINGS  Pre contrast time-of-flight neck MRA images. Antegrade flow in both carotid and vertebral arteries in the neck. Fairly codominant vertebral arteries.  Post-contrast MRA images. Three vessel arch configuration with no great vessel origin stenosis.  Moderate irregularity at the right carotid bifurcation (series 29528, image 15) but with no hemodynamically significant stenosis at the right ICA origin or bulb. Negative cervical right ICA otherwise.  Minimal to mild irregularity at the left carotid bifurcation and bulb. No cervical left ICA stenosis.  Normal left vertebral artery origin, the right is not well visualized. Slight dominance of the left vertebral artery. No vertebral artery stenosis elsewhere in the neck. Negative distal vertebral arteries, both PICA origins are patent.  MRA HEAD FINDINGS  Antegrade flow in the posterior circulation with fairly codominant distal vertebral arteries. Normal PICA origins. Normal vertebrobasilar junction. No basilar artery irregularity or stenosis. SCA origins are within normal limits.  There is a fetal type right PCA origin, however the left PCA origin is conventional (there is a small left posterior  communicating artery, and there is a short 3 mm segment of high-grade stenosis or occlusion in the left P1. See series 3, image 54.  Otherwise the bilateral PCA branches are within normal limits.  Antegrade flow in both ICA siphons. No siphon stenosis. Ophthalmic and posterior communicating artery origins are within normal limits. Normal carotid termini, MCA and ACA origins. Anterior communicating artery is diminutive or absent. Visualized bilateral ACA branches are within normal limits. Visualized bilateral MCA branches are within normal limits.  IMPRESSION: 1. Left PCA P1 short 3 mm segment of occlusion or high-grade  stenosis corresponding to the acute left midbrain and thalamus striate artery territory infarct. 2. Otherwise negative intracranial and extracranial posterior circulation. 3. Right greater than left carotid bifurcation atherosclerosis, but no anterior circulation hemodynamically significant stenosis.  Electronically Signed: By: Augusto GambleLee  Hall M.D. On: 04/15/2014 13:54   Mr Brain Wo Contrast 04/15/2014   CLINICAL DATA:  Diplopia.  EXAM: MRI HEAD WITHOUT CONTRAST  TECHNIQUE: Multiplanar, multiecho pulse sequences of the brain and surrounding structures were obtained without intravenous contrast.  COMPARISON:  None.  FINDINGS: There are small foci of acute infarction in the left thalamus slightly extending into the left cerebral peduncle. There is no evidence of intracranial hemorrhage, mass, midline shift, or extra-axial fluid collection. Ventricles and sulci are within normal limits for age. Scattered, small foci of T2 hyperintensity in the subcortical and deep cerebral white matter bilaterally are nonspecific but compatible with minimal chronic small vessel ischemic disease.  Orbits are unremarkable. There is mild right anterior ethmoid air cell mucosal thickening. Small left mastoid effusion is noted. Major intracranial vascular flow voids are preserved.  IMPRESSION: 1. Acute left thalamic infarct. 2.  Minimal chronic small vessel ischemic disease.   Electronically Signed   By: Sebastian AcheAllen  Grady   On: 04/15/2014 07:44   12-lead ECG SR, rate 73, non-specific ST-T changes  Telemetry sinus rhythm with PAC's, PVC's  Assessment and Plan:  1. Cryptogenic stroke The patient presents with cryptogenic stroke.  The patient has a TEE planned for today.  I spoke at length with the patient about monitoring for afib with either a 30 day event monitor or an implantable loop recorder.  Risks, benefits, and alteratives to implantable loop recorder were discussed with the patient today.   At this time, the patient is very clear in their decision to proceed with implantable loop recorder.   Wound care was reviewed with the patient (keep incision clean and dry for 3 days).  Wound check scheduled for 04-29-14 at [facility]  Please call with questions.

## 2014-04-16 NOTE — Progress Notes (Signed)
PT Cancellation Note  Patient Details Name: [name] CorollaJoan Bruns MRN: [id number] DOB: 1955-07-25   Cancelled Treatment:    Reason Eval/Treat Not Completed:  (Spoke with OT. No difficulties with functional abilities)  Spoke with occupational therapy after initial evaluation. OT reports patient is functioning at a high level of independence and no physical therapy is indicated at this time. PT is signing-off. Please re-order if there is any significant change in status. Thank you for this referral.  Charlsie MerlesLogan Secor Mabrey Howland, PT [phone]  Berton MountBarbour, Vuk Skillern S 04/16/2014, 8:25 AM

## 2014-04-16 NOTE — Care Management Note (Unsigned)
    Page 1 of 1   04/16/2014     4:10:12 PM CARE MANAGEMENT NOTE 04/16/2014  Patient:  [name] Wagner,[name]   Account Number:  [id number]  Date Initiated:  04/16/2014  Documentation initiated by:  Letha CapeAYLOR,Hallis Meditz  Subjective/Objective Assessment:   dx cva  admit- lives alone.     Action/Plan:   12/1- TEE  ? Loop recorder   Anticipated DC Date:  04/17/2014   Anticipated DC Plan:  HOME/SELF CARE      DC Planning Services  CM consult      Choice offered to / List presented to:             Status of service:  In process, will continue to follow Medicare Important Message given?   (If response is "NO", the following Medicare IM given date fields will be blank) Date Medicare IM given:   Medicare IM given by:   Date Additional Medicare IM given:   Additional Medicare IM given by:    Discharge Disposition:    Per UR Regulation:  Reviewed for med. necessity/level of care/duration of stay  If discussed at Long Length of Stay Meetings, dates discussed:    Comments:  04/16/14 1609 Letha Capeeborah Tziporah Knoke RN, BSN [phone] patient lives alone, NCM will cont to follow for dc needs.

## 2014-04-16 NOTE — Progress Notes (Signed)
STROKE TEAM PROGRESS NOTE   HISTORY [name] Wagner is an 58 y.o. female with known HTN and hyperlipidemia. Patient was doing fine until Wednesday. On Wednesday 04/10/2014 at 2000 she had a sudden onset of dizziness and horizontal diplopia. She noted if she closed one eye the diplopia resolved. She continued to go to work and drive but states she had to close one eye to see correctly. She also noted She would tend to lean tot he riight when she was walking. Due to her symptoms not resolving over a few days she finally came to ED to have her symptoms evaluated. Patient was not administered TPA secondary to delay in arrival. She was admitted for further evaluation and treatment.   SUBJECTIVE (INTERVAL HISTORY) Her daughter is at the bedside.  Overall she feels her condition is stable. They have multiple questions related to testing and timing of testing. Daughter is concerned patient I already have a diagnosis of atrial fibrillation that we are unaware of. Patient was seen at [facility] hospital in the past with an arrhythmia ( charts shows documentation of SVT ).   OBJECTIVE Temp:  [97.6 F (36.4 C)-98.3 F (36.8 C)] 97.6 F (36.4 C) (12/01 0529) Pulse Rate:  [63-77] 73 (12/01 0529) Cardiac Rhythm:  [-] Normal sinus rhythm (12/01 0100) Resp:  [14-18] 14 (12/01 0529) BP: (116-149)/(66-90) 133/77 mmHg (12/01 0529) SpO2:  [93 %-98 %] 97 % (12/01 0529) Weight:  [79.9 kg (176 lb 2.4 oz)] 79.9 kg (176 lb 2.4 oz) (11/30 1020)  No results for input(s): GLUCAP in the last 168 hours.  Recent Labs Lab 04/14/14 1853 04/15/14 1324  NA 139  --   K 3.6*  --   CL 97  --   CO2 30  --   GLUCOSE 100*  --   BUN 20  --   CREATININE 0.74 0.83  CALCIUM 10.2  --     Recent Labs Lab 04/14/14 1853  AST 26  ALT 35  ALKPHOS 106  BILITOT 0.6  PROT 7.8  ALBUMIN 4.2    Recent Labs Lab 04/14/14 1853 04/15/14 1324  WBC 10.2 9.5  NEUTROABS 5.9  --   HGB 15.6* 14.4  HCT 44.5 41.9  MCV  88.8 88.6  PLT 296 265   No results for input(s): CKTOTAL, CKMB, CKMBINDEX, TROPONINI in the last 168 hours.  Recent Labs  04/14/14 1853  LABPROT 13.9  INR 1.06   No results for input(s): COLORURINE, LABSPEC, PHURINE, GLUCOSEU, HGBUR, BILIRUBINUR, KETONESUR, PROTEINUR, UROBILINOGEN, NITRITE, LEUKOCYTESUR in the last 72 hours.  Invalid input(s): APPERANCEUR     Component Value Date/Time   CHOL 191 04/16/2014 0351   TRIG 163* 04/16/2014 0351   HDL 40 04/16/2014 0351   CHOLHDL 4.8 04/16/2014 0351   VLDL 33 04/16/2014 0351   LDLCALC 118* 04/16/2014 0351   No results found for: HGBA1C No results found for: LABOPIA, COCAINSCRNUR, LABBENZ, AMPHETMU, THCU, LABBARB  No results for input(s): ETH in the last 168 hours.  Mr [name] Wagner WU Contrast  04/15/2014   ADDENDUM REPORT: 04/15/2014 14:18  ADDENDUM: Study discussed by telephone with Neurohospitalist Dr. Elspeth Cho On 04/15/2014 at 1414 hrs.   Electronically Signed   By: Augusto Gamble M.D.   On: 04/15/2014 14:18   04/15/2014   CLINICAL DATA:  58 year old female with small acute left thalamic and midbrain infarct detected on MRI for evaluation of diplopia. Initial encounter.  EXAM: MRA NECK WITHOUT AND WITH CONTRAST  MRA HEAD WITHOUT CONTRAST  TECHNIQUE: Multiplanar and multiecho pulse sequences of the neck were obtained without and with intravenous contrast. Angiographic images of the neck were obtained using MRA technique without and with intravenous contast.; Angiographic images of the Circle of Willis were obtained using MRA technique without intravenous contrast.  CONTRAST:  17mL MULTIHANCE GADOBENATE DIMEGLUMINE 529 MG/ML IV SOLN  COMPARISON:  Brain MRI without contrast 0644 hr the same day.  FINDINGS: MRA NECK FINDINGS  Pre contrast time-of-flight neck MRA images. Antegrade flow in both carotid and vertebral arteries in the neck. Fairly codominant vertebral arteries.  Post-contrast MRA images. Three vessel arch configuration with no great  vessel origin stenosis.  Moderate irregularity at the right carotid bifurcation (series [id number], image 15) but with no hemodynamically significant stenosis at the right ICA origin or bulb. Negative cervical right ICA otherwise.  Minimal to mild irregularity at the left carotid bifurcation and bulb. No cervical left ICA stenosis.  Normal left vertebral artery origin, the right is not well visualized. Slight dominance of the left vertebral artery. No vertebral artery stenosis elsewhere in the neck. Negative distal vertebral arteries, both PICA origins are patent.  MRA HEAD FINDINGS  Antegrade flow in the posterior circulation with fairly codominant distal vertebral arteries. Normal PICA origins. Normal vertebrobasilar junction. No basilar artery irregularity or stenosis. SCA origins are within normal limits.  There is a fetal type right PCA origin, however the left PCA origin is conventional (there is a small left posterior communicating artery, and there is a short 3 mm segment of high-grade stenosis or occlusion in the left P1. See series 3, image 54.  Otherwise the bilateral PCA branches are within normal limits.  Antegrade flow in both ICA siphons. No siphon stenosis. Ophthalmic and posterior communicating artery origins are within normal limits. Normal carotid termini, MCA and ACA origins. Anterior communicating artery is diminutive or absent. Visualized bilateral ACA branches are within normal limits. Visualized bilateral MCA branches are within normal limits.  IMPRESSION: 1. Left PCA P1 short 3 mm segment of occlusion or high-grade stenosis corresponding to the acute left midbrain and thalamus striate artery territory infarct. 2. Otherwise negative intracranial and extracranial posterior circulation. 3. Right greater than left carotid bifurcation atherosclerosis, but no anterior circulation hemodynamically significant stenosis.  Electronically Signed: By: Augusto Gamble M.D. On: 04/15/2014 13:54   Mr Angiogram Neck  W Wo Contrast  04/15/2014   ADDENDUM REPORT: 04/15/2014 14:18  ADDENDUM: Study discussed by telephone with Neurohospitalist Dr. Elspeth Cho On 04/15/2014 at 1414 hrs.   Electronically Signed   By: Augusto Gamble M.D.   On: 04/15/2014 14:18   04/15/2014   CLINICAL DATA:  58 year old female with small acute left thalamic and midbrain infarct detected on MRI for evaluation of diplopia. Initial encounter.  EXAM: MRA NECK WITHOUT AND WITH CONTRAST  MRA HEAD WITHOUT CONTRAST  TECHNIQUE: Multiplanar and multiecho pulse sequences of the neck were obtained without and with intravenous contrast. Angiographic images of the neck were obtained using MRA technique without and with intravenous contast.; Angiographic images of the Circle of Willis were obtained using MRA technique without intravenous contrast.  CONTRAST:  17mL MULTIHANCE GADOBENATE DIMEGLUMINE 529 MG/ML IV SOLN  COMPARISON:  Brain MRI without contrast 0644 hr the same day.  FINDINGS: MRA NECK FINDINGS  Pre contrast time-of-flight neck MRA images. Antegrade flow in both carotid and vertebral arteries in the neck. Fairly codominant vertebral arteries.  Post-contrast MRA images. Three vessel arch configuration with no great vessel origin stenosis.  Moderate irregularity at  the right carotid bifurcation (series [id number], image 15) but with no hemodynamically significant stenosis at the right ICA origin or bulb. Negative cervical right ICA otherwise.  Minimal to mild irregularity at the left carotid bifurcation and bulb. No cervical left ICA stenosis.  Normal left vertebral artery origin, the right is not well visualized. Slight dominance of the left vertebral artery. No vertebral artery stenosis elsewhere in the neck. Negative distal vertebral arteries, both PICA origins are patent.  MRA HEAD FINDINGS  Antegrade flow in the posterior circulation with fairly codominant distal vertebral arteries. Normal PICA origins. Normal vertebrobasilar junction. No basilar artery  irregularity or stenosis. SCA origins are within normal limits.  There is a fetal type right PCA origin, however the left PCA origin is conventional (there is a small left posterior communicating artery, and there is a short 3 mm segment of high-grade stenosis or occlusion in the left P1. See series 3, image 54.  Otherwise the bilateral PCA branches are within normal limits.  Antegrade flow in both ICA siphons. No siphon stenosis. Ophthalmic and posterior communicating artery origins are within normal limits. Normal carotid termini, MCA and ACA origins. Anterior communicating artery is diminutive or absent. Visualized bilateral ACA branches are within normal limits. Visualized bilateral MCA branches are within normal limits.  IMPRESSION: 1. Left PCA P1 short 3 mm segment of occlusion or high-grade stenosis corresponding to the acute left midbrain and thalamus striate artery territory infarct. 2. Otherwise negative intracranial and extracranial posterior circulation. 3. Right greater than left carotid bifurcation atherosclerosis, but no anterior circulation hemodynamically significant stenosis.  Electronically Signed: By: Augusto GambleLee  Hall M.D. On: 04/15/2014 13:54   Mr Brain Wo Contrast  04/15/2014   CLINICAL DATA:  Diplopia.  EXAM: MRI HEAD WITHOUT CONTRAST  TECHNIQUE: Multiplanar, multiecho pulse sequences of the brain and surrounding structures were obtained without intravenous contrast.  COMPARISON:  None.  FINDINGS: There are small foci of acute infarction in the left thalamus slightly extending into the left cerebral peduncle. There is no evidence of intracranial hemorrhage, mass, midline shift, or extra-axial fluid collection. Ventricles and sulci are within normal limits for age. Scattered, small foci of T2 hyperintensity in the subcortical and deep cerebral white matter bilaterally are nonspecific but compatible with minimal chronic small vessel ischemic disease.  Orbits are unremarkable. There is mild right  anterior ethmoid air cell mucosal thickening. Small left mastoid effusion is noted. Major intracranial vascular flow voids are preserved.  IMPRESSION: 1. Acute left thalamic infarct. 2. Minimal chronic small vessel ischemic disease.   Electronically Signed   By: Sebastian AcheAllen  Grady   On: 04/15/2014 07:44   Carotid Doppler  There is 1-39% bilateral ICA stenosis. Vertebral artery flow is antegrade.    2D Echocardiogram  EF 55-60% with no source of embolus.    PHYSICAL EXAM Pleasant middle aged Caucasian lady not in distress.Awake alert. Afebrile. Head is nontraumatic. Neck is supple without bruit. Hearing is normal. Cardiac exam no murmur or gallop. Lungs are clear to auscultation. Distal pulses are well felt. Neurological Exam ;  Awake  Alert oriented x 3. Normal speech and language.eye movements full without nystagmus.fundi were not visualized. Vision acuity and fields appear normal. Hearing is normal. Palatal movements are normal. Face symmetric. Tongue midline. Normal strength, tone, reflexes and coordination. Normal sensation. Gait deferred. ASSESSMENT/PLAN Ms. Nicholaus CorollaJoan Lemoine is a 58 y.o. female with history of hypertension and hyperlipidemia presenting with dizziness and diplopia. She did not receive IV t-PA due to delay in arrival.  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 small vessel disease   MRA   Left PCA P1 short 3 mm segment of occlusion or high-grade stenosis   Carotid Doppler  No significant stenosis   2D Echo  No source of embolus  HgbA1c pending  goal < 7.0  TEE to look for embolic source. Arranged with [facility] for tomorrow.  If positive for PFO (patent foramen ovale), check bilateral lower extremity venous dopplers to rule out DVT as possible source of stroke.   If TEE negative, a [facility] electrophysiologist will consult and consider placement of an implantable loop recorder to evaluate for atrial fibrillation as etiology of stroke. This has been explained to patient/family by Dr. Pearlean BrownieSethi and they are agreeable. Per daughter, pt with documented hx of SVT in our medical record, however, she is unclear this was actual diagnosis when she was see at [facility] with an arrhythmia. Awaiting records from them to review.  Heparin 5000 units sq tid for VTE prophylaxis  Diet Heart thin prior to his bowel likely or rail to the people around and exactly how the sometimes with a rope around the remaining another probably somebody copied in all liquids  no antithombotics prior to admission, now on aspirin 81 mg orally every day  Patient counseled to be compliant with her antithrombotic medications  Ongoing aggressive stroke risk factor management  Therapy recommendations:  OP OT  Disposition:  Discharge home  Hypertension  Stable  Hyperlipidemia  Home meds:  lipitor 40 daily,  resumed in hospital  LDL 118, goal < 70  Continue statin at discharge  Other Stroke Risk Factors  ETOH use - 1 bottle wine a night  Obesity, Body mass index is 31.21 kg/(m^2).   Other Active Problems  Hypothyroidism  Depression/anxiety  Hospital day # 2  BIBY,SHARON  04/16/2014 3:33 PM  I have personally examined this patient, reviewed notes, independently viewed imaging studies, participated in medical decision making and plan of care. I have made any additions or clarifications directly to the above note. Agree with note above. Strong suspicion for cardioembolic stroke. Will need TEE and loop recorder unless prior h/o atrial fibrillation is confirmed.Long discusssion with patient and family and answered questions.  Delia HeadyPramod Sethi, MD Medical Director [facility] Pager: [phone] 04/16/2014 7:49 PM    To contact Stroke Continuity provider,  please refer to [email or web address]. After hours, contact General Neurology

## 2014-04-17 ENCOUNTER — Encounter (HOSPITAL_COMMUNITY): Payer: Self-pay

## 2014-04-17 ENCOUNTER — Encounter (HOSPITAL_COMMUNITY)
Admission: EM | Disposition: A | Discharge status: Home / Self Care | Payer: BC Managed Care – PPO | Source: Home / Self Care | Attending: Internal Medicine

## 2014-04-17 DIAGNOSIS — I639 Cerebral infarction, unspecified: Secondary | ICD-10-CM

## 2014-04-17 HISTORY — PX: LOOP RECORDER IMPLANT: SHX5477

## 2014-04-17 HISTORY — PX: TEE WITHOUT CARDIOVERSION: SHX5443

## 2014-04-17 HISTORY — PX: LOOP RECORDER IMPLANT: SHX5954

## 2014-04-17 SURGERY — ECHOCARDIOGRAM, TRANSESOPHAGEAL
Anesthesia: Moderate Sedation

## 2014-04-17 SURGERY — LOOP RECORDER IMPLANT
Anesthesia: LOCAL

## 2014-04-17 MED ORDER — LIDOCAINE-EPINEPHRINE 1 %-1:100000 IJ SOLN
INTRAMUSCULAR | Status: AC
  Filled 2014-04-17: qty 1

## 2014-04-17 MED ORDER — FENTANYL CITRATE 0.05 MG/ML IJ SOLN
INTRAMUSCULAR | Status: DC | PRN
  Administered 2014-04-17 (×3): 25 ug via INTRAVENOUS

## 2014-04-17 MED ORDER — ASPIRIN 81 MG PO CHEW: 81.0000 mg | CHEWABLE_TABLET | Freq: Every day | ORAL | Status: AC

## 2014-04-17 MED ORDER — HYDRALAZINE HCL 20 MG/ML IJ SOLN
5.0000 mg | Freq: Once | INTRAMUSCULAR | Status: AC
  Administered 2014-04-17: 5 mg via INTRAVENOUS
  Filled 2014-04-17: qty 1

## 2014-04-17 MED ORDER — STROKE: EARLY STAGES OF RECOVERY BOOK
Freq: Once | Status: DC
  Filled 2014-04-17: qty 1

## 2014-04-17 MED ORDER — MIDAZOLAM HCL 5 MG/ML IJ SOLN
INTRAMUSCULAR | Status: AC
  Filled 2014-04-17: qty 2

## 2014-04-17 MED ORDER — FENTANYL CITRATE 0.05 MG/ML IJ SOLN
INTRAMUSCULAR | Status: AC
  Filled 2014-04-17: qty 2

## 2014-04-17 MED ORDER — MIDAZOLAM HCL 10 MG/2ML IJ SOLN
INTRAMUSCULAR | Status: DC | PRN
  Administered 2014-04-17 (×2): 2 mg via INTRAVENOUS
  Administered 2014-04-17: 1 mg via INTRAVENOUS

## 2014-04-17 MED ORDER — SODIUM CHLORIDE 0.9 % IV SOLN
INTRAVENOUS | Status: DC
  Administered 2014-04-17: 15:00:00 via INTRAVENOUS

## 2014-04-17 NOTE — Plan of Care (Signed)
Problem: Progression Outcomes Goal: Communication method established Outcome: Completed/Met Date Met:  04/17/14 Goal: If vent dependent, tolerates weaning Outcome: Not Applicable Date Met:  64/38/37 Goal: Rehab Team goals identified Outcome: Completed/Met Date Met:  04/17/14 Goal: Progressive activity as tolerated Outcome: Completed/Met Date Met:  04/17/14 Goal: Tolerating diet/TF at goal rate Outcome: Completed/Met Date Met:  04/17/14 Goal: Pain controlled Outcome: Completed/Met Date Met:  04/17/14 Goal: Bowel & Bladder Continence Outcome: Completed/Met Date Met:  04/17/14 Goal: Educational plan initiated Outcome: Completed/Met Date Met:  04/17/14 Goal: Initial discharge plan initiated Outcome: Completed/Met Date Met:  04/17/14 Goal: Other Progression Outcomes Outcome: Completed/Met Date Met:  04/17/14  Problem: Discharge/Transitional Outcomes Goal: Hemodynamically stable Outcome: Completed/Met Date Met:  04/17/14 Goal: If vent dependent, trach in place Outcome: Not Applicable Date Met:  79/39/68 Goal: Independent mobility/functioning independent or with min Independent mobility/functioning independently or with minimal assistance  Outcome: Completed/Met Date Met:  04/17/14 Goal: Tolerating diet/TF at goal rate-PEG if inadequate intake Outcome: Not Applicable Date Met:  86/48/47

## 2014-04-17 NOTE — Progress Notes (Signed)
STROKE TEAM PROGRESS NOTE   HISTORY [name] Wagner is an 58 y.o. female with known HTN and hyperlipidemia. Patient was doing fine until Wednesday. On Wednesday 04/10/2014 at 2000 she had a sudden onset of dizziness and horizontal diplopia. She noted if she closed one eye the diplopia resolved. She continued to go to work and drive but states she had to close one eye to see correctly. She also noted She would tend to lean tot he riight when she was walking. Due to her symptoms not resolving over a few days she finally came to ED to have her symptoms evaluated. Patient was not administered TPA secondary to delay in arrival. She was admitted for further evaluation and treatment.   SUBJECTIVE (INTERVAL HISTORY)  Overall she feels her condition is stable.  Awaiting TEE and loop recorder  OBJECTIVE Temp:  [97.4 F (36.3 C)-98.7 F (37.1 C)] 97.4 F (36.3 C) (12/02 0603) Pulse Rate:  [79-95] 95 (12/02 1425) Cardiac Rhythm:  [-] Normal sinus rhythm (12/02 0800) Resp:  [18-20] 18 (12/02 1425) BP: (147-164)/(86-103) 161/103 mmHg (12/02 1425) SpO2:  [96 %-99 %] 97 % (12/02 1425) Weight:  [176 lb (79.833 kg)] 176 lb (79.833 kg) (12/02 1425)  No results for input(s): GLUCAP in the last 168 hours.  Recent Labs Lab 04/14/14 1853 04/15/14 1324  NA 139  --   K 3.6*  --   CL 97  --   CO2 30  --   GLUCOSE 100*  --   BUN 20  --   CREATININE 0.74 0.83  CALCIUM 10.2  --     Recent Labs Lab 04/14/14 1853  AST 26  ALT 35  ALKPHOS 106  BILITOT 0.6  PROT 7.8  ALBUMIN 4.2    Recent Labs Lab 04/14/14 1853 04/15/14 1324  WBC 10.2 9.5  NEUTROABS 5.9  --   HGB 15.6* 14.4  HCT 44.5 41.9  MCV 88.8 88.6  PLT 296 265   No results for input(s): CKTOTAL, CKMB, CKMBINDEX, TROPONINI in the last 168 hours.  Recent Labs  04/14/14 1853  LABPROT 13.9  INR 1.06   No results for input(s): COLORURINE, LABSPEC, PHURINE, GLUCOSEU, HGBUR, BILIRUBINUR, KETONESUR, PROTEINUR, UROBILINOGEN, NITRITE,  LEUKOCYTESUR in the last 72 hours.  Invalid input(s): APPERANCEUR     Component Value Date/Time   CHOL 191 04/16/2014 0351   TRIG 163* 04/16/2014 0351   HDL 40 04/16/2014 0351   CHOLHDL 4.8 04/16/2014 0351   VLDL 33 04/16/2014 0351   LDLCALC 118* 04/16/2014 0351   Lab Results  Component Value Date   HGBA1C 5.7* 04/16/2014   No results found for: LABOPIA, COCAINSCRNUR, LABBENZ, AMPHETMU, THCU, LABBARB  No results for input(s): ETH in the last 168 hours.  No results found. Carotid Doppler  There is 1-39% bilateral ICA stenosis. Vertebral artery flow is antegrade.    2D Echocardiogram  EF 55-60% with no source of embolus.    PHYSICAL EXAM Pleasant middle aged Caucasian lady not in distress.Awake alert. Afebrile. Head is nontraumatic. Neck is supple without bruit. Hearing is normal. Cardiac exam no murmur or gallop. Lungs are clear to auscultation. Distal pulses are well felt. Neurological Exam ;  Awake  Alert oriented x 3. Normal speech and language.eye movements full without nystagmus.fundi were not visualized. Vision acuity and fields appear normal. Hearing is normal. Palatal movements are normal. Face symmetric. Tongue midline. Normal strength, tone, reflexes and coordination. Normal sensation. Gait deferred. ASSESSMENT/PLAN [name] is a 58 y.o. female with history of  hypertension and hyperlipidemia presenting with dizziness and diplopia. She did not receive IV t-PA due to delay in arrival.   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 small vessel disease   MRA   Left PCA P1 short 3 mm segment of occlusion or high-grade stenosis   Carotid Doppler  No significant stenosis   2D Echo  No source of embolus  HgbA1c pending  goal < 7.0  TEE to look for embolic source. Arranged with [facility]  for tomorrow.  If positive for PFO (patent foramen ovale), check bilateral lower extremity venous dopplers to rule out DVT as possible source of stroke.   If TEE negative, a [facility] electrophysiologist will consult and consider placement of an implantable loop recorder to evaluate for atrial fibrillation as etiology of stroke. This has been explained to patient/family by Dr. Pearlean BrownieSethi and they are agreeable. Per daughter, pt with documented hx of SVT in our medical record, however, she is unclear this was actual diagnosis when she was see [facility] with an arrhythmia. Awaiting records from them to review.  Heparin 5000 units sq tid for VTE prophylaxis  Diet NPO time specified thin prior to his bowel likely or rail to the people around and exactly how the sometimes with a rope around the remaining another probably somebody copied in all liquids  no antithombotics prior to admission, now on aspirin 81 mg orally every day  Patient counseled to be compliant with her antithrombotic medications  Ongoing aggressive stroke risk factor management  Therapy recommendations:  OP OT  Disposition:  Discharge home  Hypertension  Stable  Hyperlipidemia  Home meds:  lipitor 40 daily,  resumed in hospital  LDL 118, goal < 70  Continue statin at discharge  Other Stroke Risk Factors  ETOH use - 1 bottle wine a night  Obesity, Body mass index is 32.18 kg/(m^2).   Other Active Problems  Hypothyroidism  Depression/anxiety  Hospital day # 3  SETHI,PRAMOD  04/17/2014 2:49 PM  I have personally examined this patient, reviewed notes, independently viewed imaging studies, participated in medical decision making and plan of care. I have made any additions or clarifications directly to the above note. Agree with note above. Strong suspicion for cardioembolic stroke. Plan  TEE and loop recorder today unless prior h/o atrial fibrillation is confirmed.  Delia HeadyPramod  Sethi, MD Medical Director [facility] Pager: [phone] 04/17/2014 2:49 PM    To contact Stroke Continuity provider, please refer to [email or web address]. After hours, contact General Neurology

## 2014-04-17 NOTE — Progress Notes (Signed)
NURSING PROGRESS NOTE  [name] CorollaJoan Wagner [id number] Discharge Data: 04/17/2014 6:55 PM Attending Provider: Leatha Gildingostin M Gherghe, MD YQM:VHQION,GEXBMPCP:SWAYNE,DAVID Lacretia NicksW, MD   [name] Wagner to be D/C'd Home per MD order.    All IV's will be discontinued and monitored for bleeding.  All belongings will be returned to patient for patient to take home.  Last Documented Vital Signs:  Blood pressure 144/98, pulse 97, temperature 97.7 F (36.5 C), temperature source Oral, resp. rate 14, height 5\' 2"  (1.575 m), weight 79.833 kg (176 lb), SpO2 99 %.  Madelin RearLonnie Omid Deardorff, MSN, RN, [employer]

## 2014-04-17 NOTE — CV Procedure (Signed)
     Transesophageal Echocardiogram Note  [name] Wagner [id number] 10-Feb-1956  Procedure: Transesophageal Echocardiogram Indications: Stroke  Procedure Details Consent: Obtained Time Out: Verified patient identification, verified procedure, site/side was marked, verified correct patient position, special equipment/implants available, Radiology Safety Procedures followed,  medications/allergies/relevent history reviewed, required imaging and test results available.  Performed  Medications: Fentanyl: 50 mcg Versed: 4 mg  Left Ventrical:  The cavity size was normal. Wall thickness was increased in a pattern of moderate LVH. Systolic function was normal. The estimated ejection fraction was in the range of 60% to 65%. Wall motion was normal; there were no regional wall motion abnormalities.   No ASD or PFO by color Doppler or agitated saline study.   No source of embolism was identified. The patient can proceed with a loop recorder implantation.   Complications: No apparent complications Patient did tolerate procedure well.  Lars MassonNELSON, [name] Abee H, MD, [facility] 04/17/2014, 1:39 PM

## 2014-04-17 NOTE — Op Note (Signed)
SURGEON:  Hillis RangeJames Frankye Schwegel, MD     PREPROCEDURE DIAGNOSIS:  Cryptogenic Stroke    POSTPROCEDURE DIAGNOSIS:  Cryptogenic Stroke     PROCEDURES:   1. Implantable loop recorder implantation    INTRODUCTION:  [name] Wagner is a 58 y.o. female with a history of unexplained stroke who presents today for implantable loop implantation.  The patient has had a cryptogenic stroke.  Despite an extensive workup by neurology, no reversible causes have been identified.  she has worn telemetry during which she did not have arrhythmias.  There is significant concern for possible atrial fibrillation as the cause for the patients stroke.  The patient therefore presents today for implantable loop implantation.     DESCRIPTION OF PROCEDURE:  Informed written consent was obtained, and the patient was brought to the electrophysiology lab in a fasting state.  The patient required no sedation for the procedure today.  Mapping over the patient's chest was performed by the EP lab staff to identify the area where electrograms were most prominent for ILR recording.  This area was found to be the left parasternal region over the 3rd-4th intercostal space. The patients left chest was therefore prepped and draped in the usual sterile fashion by the EP lab staff. The skin overlying the left parasternal region was infiltrated with lidocaine for local analgesia.  A 0.5-cm incision was made over the left parasternal region over the 3rd intercostal space.  A subcutaneous ILR pocket was fashioned using a combination of sharp and blunt dissection.  A Medtronic Reveal [place]q model [id number] SN [id number] S implantable loop recorder was then placed into the pocket  R waves were very prominent and measured 0.1639mV. EBL<1 ml.  Steri- Strips and a sterile dressing were then applied.  There were no early apparent complications.     CONCLUSIONS:   1. Successful implantation of a Medtronic Reveal LINQ implantable loop recorder for cryptogenic stroke  2. No  early apparent complications.

## 2014-04-17 NOTE — Plan of Care (Signed)
Problem: Consults Goal: Ischemic Stroke Patient Education See Patient Education Module for education specifics.  Outcome: Completed/Met Date Met:  04/17/14

## 2014-04-17 NOTE — Discharge Summary (Signed)
Physician Discharge Summary  [name] Wagner ZOX:[id number] DOB: 30-Nov-1955 DOA: 04/14/2014  PCP: Sissy Hoff, MD  Admit date: 04/14/2014 Discharge date: 04/17/2014  Time spent: 35 minutes  Recommendations for Outpatient Follow-up:  1. Follow up with Dr. Azucena Cecil in 1-2 weeks 2. Follow up with Dr. Pearlean Brownie in 4 weeks 3. Follow up with Cardiology after loop recorder implant   Discharge Diagnoses:  Principal Problem:   CVA (cerebral vascular accident) Active Problems:   CVA (cerebral infarction)   HTN (hypertension)   HLD (hyperlipidemia)   Hypothyroidism   Stroke   Diplopia  Discharge Condition: stable  Diet recommendation: heart healthy  Filed Weights   04/14/14 1810 04/15/14 1020 04/17/14 1425  Weight: 80.287 kg (177 lb) 79.9 kg (176 lb 2.4 oz) 79.833 kg (176 lb)    History of present illness:  [name] Wagner is a 58yo woman with PMH of HTN, hypothyroidism, h/o SVT and HLD who presented due to above. [name] Wagner reports that she began having symptoms on Wednesday, particularly dizziness and blurred vision and double vision. She reports that she had to work, so she did not come in to be evaluated. The dizziness improved after one day, but the vision changes persisted. She denied any other symptoms including chest pain, fever, chills, nausea, vomiting, diarrhea, constipation, change in hearing, tingling, weakness, lightheadedness, syncope, change in sensation, inability to walk or gait changes. Per her daughter, she had some mental status changes this morning and possibly some slurred speech, but these have all completely resolved. The double vision is the only thing that persists.   Hospital Course:  CVA; left thalamic stroke - with horizontal diplopia. Patient was admitted on telemetry, and urology was consulted. Stroke team has seen and evaluated and followed patient while hospitalized. Patient underwent a 2-D echo which showed normal ejection fraction 60-65%, grade 1  diastolic dysfunction. AT neurology recommendations she underwent a TEE which was negative for a PFO or other sources for emboli. She was started on Aspirin daily and is to continue her statin on discharge. Telemetry showed sinus rhythm without any evidence of dysrhythmia, thus EP was consulted and patient had an implantable loop recorder placed prior to discharge. Outpatient occupational therapy was recommended, and this has been prescribed on discharge. She will see ophthalmology tomorrow for further evaluation of her diplopia and longitudinal follow up. She will follow up with her PCP, Neurology and cardiology as an outpatient.  HTN (hypertension)- Allow permissive HTN, hold BP medications including metoprolol, lisinopril-hctz, resume on discharge.  HLD (hyperlipidemia) - Continue home atorvastatin Hypothyroidism - Continue home synthroid ETOH use - Reports 1 bottle of wine a night, did not trigger CIWA Depression/anxiety - Continue home cymbalta and xanax  Procedures:  2D echo - Left ventricle: The cavity size was normal. Wall thickness wasincreased in a pattern of moderate LVH. Systolic function was normal. The estimated ejection fraction was in the range of 60% to 65%. Wall motion was normal; there were no regional wall motion abnormalities. Doppler parameters are consistent with abnormal left ventricular relaxation (grade 1 diastolicdysfunction).  TEE The cavity size was normal. Wall thickness was increased in a pattern of moderate LVH. Systolic function was normal. The estimated ejection fraction was in the range of 60% to 65%. Wall motion was normal; there were no regional wall motion abnormalities. No ASD or PFO by color Doppler or agitated saline study.   Carotid doppler  - The vertebral arteries appear patent with antegrade flow. Findings consistent with 1-39 percent stenosis involving the  right internal carotid artery and the left internal carotid  artery.  Consultations:  Neurology  Cardiology   Discharge Exam: Filed Vitals:   04/17/14 1600 04/17/14 1610 04/17/14 1620 04/17/14 1630  BP: 184/97 172/104 174/122 144/98  Pulse: 98 94 88 97  Temp: 97.7 F (36.5 C)     TempSrc:      Resp: 14 21 17 14   Height:      Weight:      SpO2: 97% 97% 98% 99%    General: NAD Cardiovascular: RRR Respiratory: CTA biL  Discharge Instructions     Medication List    TAKE these medications        ALPRAZolam 0.5 MG tablet  Commonly known as:  XANAX  Take 0.5 mg by mouth at bedtime as needed for anxiety.     aspirin 81 MG chewable tablet  Chew 1 tablet (81 mg total) by mouth daily.     atorvastatin 40 MG tablet  Commonly known as:  LIPITOR  Take 40 mg by mouth daily.     DULoxetine 60 MG capsule  Commonly known as:  CYMBALTA  Take 60 mg by mouth daily.     levothyroxine 112 MCG tablet  Commonly known as:  SYNTHROID, LEVOTHROID  Take 224 mcg by mouth daily.     lisinopril-hydrochlorothiazide 20-25 MG per tablet  Commonly known as:  PRINZIDE,ZESTORETIC  Take 1 tablet by mouth daily.     metoprolol succinate 100 MG 24 hr tablet  Commonly known as:  TOPROL-XL  Take 100 mg by mouth daily. Take with or immediately following a meal.           Follow-up Information    Follow up with Sissy Hoff, MD. Schedule an appointment as soon as possible for a visit in 2 weeks.   Specialty:  Family Medicine   Contact information:   [address], [address] [place] [address] [phone]       Follow up with SETHI,PRAMOD, MD. Schedule an appointment as soon as possible for a visit in 1 month.   Specialties:  Neurology, Radiology   Contact information:   [address] [place] [address] [phone]       The results of significant diagnostics from this hospitalization (including imaging, microbiology, ancillary and laboratory) are listed below for reference.    Significant Diagnostic  Studies: Mr Shirlee Latch VH Contrast  04/15/2014   ADDENDUM REPORT: 04/15/2014 14:18  ADDENDUM: Study discussed by telephone with Neurohospitalist Dr. Elspeth Cho On 04/15/2014 at 1414 hrs.   Electronically Signed   By: Augusto Gamble M.D.   On: 04/15/2014 14:18   04/15/2014   CLINICAL DATA:  58 year old female with small acute left thalamic and midbrain infarct detected on MRI for evaluation of diplopia. Initial encounter.  EXAM: MRA NECK WITHOUT AND WITH CONTRAST  MRA HEAD WITHOUT CONTRAST  TECHNIQUE: Multiplanar and multiecho pulse sequences of the neck were obtained without and with intravenous contrast. Angiographic images of the neck were obtained using MRA technique without and with intravenous contast.; Angiographic images of the Circle of Willis were obtained using MRA technique without intravenous contrast.  CONTRAST:  17mL MULTIHANCE GADOBENATE DIMEGLUMINE 529 MG/ML IV SOLN  COMPARISON:  Brain MRI without contrast 0644 hr the same day.  FINDINGS: MRA NECK FINDINGS  Pre contrast time-of-flight neck MRA images. Antegrade flow in both carotid and vertebral arteries in the neck. Fairly codominant vertebral arteries.  Post-contrast MRA images. Three vessel arch configuration with no great  vessel origin stenosis.  Moderate irregularity at the right carotid bifurcation (series [id number], image 15) but with no hemodynamically significant stenosis at the right ICA origin or bulb. Negative cervical right ICA otherwise.  Minimal to mild irregularity at the left carotid bifurcation and bulb. No cervical left ICA stenosis.  Normal left vertebral artery origin, the right is not well visualized. Slight dominance of the left vertebral artery. No vertebral artery stenosis elsewhere in the neck. Negative distal vertebral arteries, both PICA origins are patent.  MRA HEAD FINDINGS  Antegrade flow in the posterior circulation with fairly codominant distal vertebral arteries. Normal PICA origins. Normal vertebrobasilar junction. No  basilar artery irregularity or stenosis. SCA origins are within normal limits.  There is a fetal type right PCA origin, however the left PCA origin is conventional (there is a small left posterior communicating artery, and there is a short 3 mm segment of high-grade stenosis or occlusion in the left P1. See series 3, image 54.  Otherwise the bilateral PCA branches are within normal limits.  Antegrade flow in both ICA siphons. No siphon stenosis. Ophthalmic and posterior communicating artery origins are within normal limits. Normal carotid termini, MCA and ACA origins. Anterior communicating artery is diminutive or absent. Visualized bilateral ACA branches are within normal limits. Visualized bilateral MCA branches are within normal limits.  IMPRESSION: 1. Left PCA P1 short 3 mm segment of occlusion or high-grade stenosis corresponding to the acute left midbrain and thalamus striate artery territory infarct. 2. Otherwise negative intracranial and extracranial posterior circulation. 3. Right greater than left carotid bifurcation atherosclerosis, but no anterior circulation hemodynamically significant stenosis.  Electronically Signed: By: Augusto GambleLee  Hall M.D. On: 04/15/2014 13:54   [name]  04/15/2014   ADDENDUM REPORT: 04/15/2014 14:18  ADDENDUM: Study discussed by telephone with Neurohospitalist Dr. Elspeth ChoPeter Sumner On 04/15/2014 at 1414 hrs.   Electronically Signed   By: Augusto GambleLee  Hall M.D.   On: 04/15/2014 14:18   04/15/2014   CLINICAL DATA:  58 year old female with small acute left thalamic and midbrain infarct detected on MRI for evaluation of diplopia. Initial encounter.  EXAM: MRA NECK WITHOUT AND WITH CONTRAST  MRA HEAD WITHOUT CONTRAST  TECHNIQUE: Multiplanar and multiecho pulse sequences of the neck were obtained without and with intravenous contrast. Angiographic images of the neck were obtained using MRA technique without and with intravenous contast.; Angiographic images of the Circle of  Willis were obtained using MRA technique without intravenous contrast.  CONTRAST:  17mL MULTIHANCE GADOBENATE DIMEGLUMINE 529 MG/ML IV SOLN  COMPARISON:  Brain MRI without contrast 0644 hr the same day.  FINDINGS: MRA NECK FINDINGS  Pre contrast time-of-flight neck MRA images. Antegrade flow in both carotid and vertebral arteries in the neck. Fairly codominant vertebral arteries.  Post-contrast MRA images. Three vessel arch configuration with no great vessel origin stenosis.  Moderate irregularity at the right carotid bifurcation (series [id number], image 15) but with no hemodynamically significant stenosis at the right ICA origin or bulb. Negative cervical right ICA otherwise.  Minimal to mild irregularity at the left carotid bifurcation and bulb. No cervical left ICA stenosis.  Normal left vertebral artery origin, the right is not well visualized. Slight dominance of the left vertebral artery. No vertebral artery stenosis elsewhere in the neck. Negative distal vertebral arteries, both PICA origins are patent.  MRA HEAD FINDINGS  Antegrade flow in the posterior circulation with fairly codominant distal vertebral arteries. Normal PICA origins. Normal vertebrobasilar junction. No basilar artery irregularity or  stenosis. SCA origins are within normal limits.  There is a fetal type right PCA origin, however the left PCA origin is conventional (there is a small left posterior communicating artery, and there is a short 3 mm segment of high-grade stenosis or occlusion in the left P1. See series 3, image 54.  Otherwise the bilateral PCA branches are within normal limits.  Antegrade flow in both ICA siphons. No siphon stenosis. Ophthalmic and posterior communicating artery origins are within normal limits. Normal carotid termini, MCA and ACA origins. Anterior communicating artery is diminutive or absent. Visualized bilateral ACA branches are within normal limits. Visualized bilateral MCA branches are within normal limits.   IMPRESSION: 1. Left PCA P1 short 3 mm segment of occlusion or high-grade stenosis corresponding to the acute left midbrain and thalamus striate artery territory infarct. 2. Otherwise negative intracranial and extracranial posterior circulation. 3. Right greater than left carotid bifurcation atherosclerosis, but no anterior circulation hemodynamically significant stenosis.  Electronically Signed: By: Augusto GambleLee  Hall M.D. On: 04/15/2014 13:54   Mr Brain Wo Contrast  04/15/2014   CLINICAL DATA:  Diplopia.  EXAM: MRI HEAD WITHOUT CONTRAST  TECHNIQUE: Multiplanar, multiecho pulse sequences of the brain and surrounding structures were obtained without intravenous contrast.  COMPARISON:  None.  FINDINGS: There are small foci of acute infarction in the left thalamus slightly extending into the left cerebral peduncle. There is no evidence of intracranial hemorrhage, mass, midline shift, or extra-axial fluid collection. Ventricles and sulci are within normal limits for age. Scattered, small foci of T2 hyperintensity in the subcortical and deep cerebral white matter bilaterally are nonspecific but compatible with minimal chronic small vessel ischemic disease.  Orbits are unremarkable. There is mild right anterior ethmoid air cell mucosal thickening. Small left mastoid effusion is noted. Major intracranial vascular flow voids are preserved.  IMPRESSION: 1. Acute left thalamic infarct. 2. Minimal chronic small vessel ischemic disease.   Electronically Signed   By: Sebastian AcheAllen  Grady   On: 04/15/2014 07:44    Microbiology: No results found for this or any previous visit (from the past 240 hour(s)).   Labs: Basic Metabolic Panel:  Recent Labs Lab 04/14/14 1853 04/15/14 1324  NA 139  --   K 3.6*  --   CL 97  --   CO2 30  --   GLUCOSE 100*  --   BUN 20  --   CREATININE 0.74 0.83  CALCIUM 10.2  --    Liver Function Tests:  Recent Labs Lab 04/14/14 1853  AST 26  ALT 35  ALKPHOS 106  BILITOT 0.6  PROT 7.8   ALBUMIN 4.2   CBC:  Recent Labs Lab 04/14/14 1853 04/15/14 1324  WBC 10.2 9.5  NEUTROABS 5.9  --   HGB 15.6* 14.4  HCT 44.5 41.9  MCV 88.8 88.6  PLT 296 265    Signed:  Lavanda Nevels  [facility] 04/17/2014, 7:00 PM

## 2014-04-17 NOTE — Progress Notes (Signed)
  Echocardiogram Echocardiogram Transesophageal has been performed.  Leta JunglingCooper, Deoni Cosey M 04/17/2014, 4:22 PM

## 2014-04-17 NOTE — H&P (View-Only) (Signed)
ELECTROPHYSIOLOGY CONSULT NOTE  Patient ID: [name] Wagner Tuckerman MRN: [id number], DOB/AGE: August 02, 1955   Admit date: 04/14/2014 Date of Consult: 04/16/2014  Primary Physician: Sissy HoffSWAYNE,DAVID W, MD [facility] Reason for Consultation: Cryptogenic stroke; recommendations regarding Implantable Loop Recorder  History of Present Illness [name] Wagner Norgren was admitted on 04/14/2014 with dizziness and horizontal diplopia.  Imaging demonstrated acute left thalamic infarct.  She has undergone workup for stroke including echocardiogram and carotid dopplers.  The patient has been monitored on telemetry which has demonstrated sinus rhythm with no arrhythmias.  Inpatient stroke work-up is to be completed with a TEE.   Echocardiogram this admission demonstrated EF 60-65%, no RWMA, grade 1 diastolic dysfunction, LA 27.  Lab work is reviewed.   Prior to admission, the patient denies chest pain, shortness of breath, dizziness, palpitations, or syncope.  They are recovering from their stroke with plans to return home at discharge.  EP has been asked to evaluate for placement of an implantable loop recorder to monitor for atrial fibrillation.  ROS is negative except as outlined above.    Past Medical History  Diagnosis Date  . Thyroid disease   . Hyperlipidemia   . Hypothyroidism   . SVT (supraventricular tachycardia)   . Shingles   . Hypertension   . Stroke 04/15/2014  . Depression   . Anxiety      Surgical History:  Past Surgical History  Procedure Laterality Date  . Tubal ligation       Prescriptions prior to admission  Medication Sig Dispense Refill Last Dose  . ALPRAZolam (XANAX) 0.5 MG tablet Take 0.5 mg by mouth at bedtime as needed for anxiety.   Past Month at Unknown time  . atorvastatin (LIPITOR) 40 MG tablet Take 40 mg by mouth daily.   04/14/2014 at Unknown time  . DULoxetine (CYMBALTA) 60 MG capsule Take 60 mg by mouth daily.   04/14/2014 at Unknown  time  . levothyroxine (SYNTHROID, LEVOTHROID) 112 MCG tablet Take 224 mcg by mouth daily.    04/14/2014 at Unknown time  . lisinopril-hydrochlorothiazide (PRINZIDE,ZESTORETIC) 20-25 MG per tablet Take 1 tablet by mouth daily.   04/14/2014 at Unknown time  . metoprolol succinate (TOPROL-XL) 100 MG 24 hr tablet Take 100 mg by mouth daily. Take with or immediately following a meal.   04/14/2014 at Unknown time    Inpatient Medications:  . aspirin  81 mg Oral Daily  . atorvastatin  40 mg Oral q1800  . docusate sodium  100 mg Oral BID  . DULoxetine  60 mg Oral Daily  . folic acid  1 mg Oral Daily  . heparin  5,000 Units Subcutaneous 3 times per day  . levothyroxine  224 mcg Oral QAC breakfast  . multivitamin with minerals  1 tablet Oral Daily  . sodium chloride  3 mL Intravenous Q12H  . sodium chloride  3 mL Intravenous Q12H  . thiamine  100 mg Oral Daily   Or  . thiamine  100 mg Intravenous Daily    Allergies:  Allergies  Allergen Reactions  . Amoxicillin Rash    History   Social History  . Marital Status: Divorced    Spouse Name: N/A    Number of Children: N/A  . Years of Education: N/A   Occupational History  . Not on file.   Social History Main Topics  . Smoking status: Never Smoker   . Smokeless tobacco: Never Used  . Alcohol Use: Yes     Comment:  Reports 1 bottle of wine a night.   . Drug Use: No  . Sexual Activity: Yes    Birth Control/ Protection: None   Other Topics Concern  . Not on file   Social History Narrative     Family History  Problem Relation Age of Onset  . Coronary artery disease Mother   . Coronary artery disease Brother   . Heart attack Brother      Physical Exam: Filed Vitals:   09-May-2014 2019 04/16/14 0149 04/16/14 0529 04/16/14 1238  BP: 138/90 149/86 133/77 134/80  Pulse: 75 63 73 76  Temp: 98.3 F (36.8 C) 97.6 F (36.4 C) 97.6 F (36.4 C) 97.6 F (36.4 C)  TempSrc: Oral Oral Oral Oral  Resp: 18 18 14 16   Height:        Weight:      SpO2: 93% 93% 97% 98%    GEN- The patient is well appearing, alert and oriented x 3 today.   Head- normocephalic, atraumatic Eyes-  Sclera clear, conjunctiva pink Ears- hearing intact Oropharynx- clear Neck- supple, Lungs- Clear to ausculation bilaterally, normal work of breathing Heart- Regular rate and rhythm, no murmurs, rubs or gallops, PMI not laterally displaced GI- soft, NT, ND, + BS Extremities- no clubbing, cyanosis, or edema MS- no significant deformity or atrophy Skin- no rash or lesion Psych- euthymic mood, full affect   Labs:   Lab Results  Component Value Date   WBC 9.5 2014-05-09   HGB 14.4 05-09-14   HCT 41.9 09-May-2014   MCV 88.6 May 09, 2014   PLT 265 2014-05-09    Recent Labs Lab 04/14/14 1853 May 09, 2014 1324  NA 139  --   K 3.6*  --   CL 97  --   CO2 30  --   BUN 20  --   CREATININE 0.74 0.83  CALCIUM 10.2  --   PROT 7.8  --   BILITOT 0.6  --   ALKPHOS 106  --   ALT 35  --   AST 26  --   GLUCOSE 100*  --      Radiology/Studies: Mr Shirlee Latch Wo Contrast May 09, 2014   CLINICAL DATA:  58 year old female with small acute left thalamic and midbrain infarct detected on MRI for evaluation of diplopia. Initial encounter.  EXAM: MRA NECK WITHOUT AND WITH CONTRAST  MRA HEAD WITHOUT CONTRAST  TECHNIQUE: Multiplanar and multiecho pulse sequences of the neck were obtained without and with intravenous contrast. Angiographic images of the neck were obtained using MRA technique without and with intravenous contast.; Angiographic images of the Circle of Willis were obtained using MRA technique without intravenous contrast.  CONTRAST:  17mL MULTIHANCE GADOBENATE DIMEGLUMINE 529 MG/ML IV SOLN  COMPARISON:  Brain MRI without contrast 0644 hr the same day.  FINDINGS: MRA NECK FINDINGS  Pre contrast time-of-flight neck MRA images. Antegrade flow in both carotid and vertebral arteries in the neck. Fairly codominant vertebral arteries.  Post-contrast MRA images.  Three vessel arch configuration with no great vessel origin stenosis.  Moderate irregularity at the right carotid bifurcation (series 96045, image 15) but with no hemodynamically significant stenosis at the right ICA origin or bulb. Negative cervical right ICA otherwise.  Minimal to mild irregularity at the left carotid bifurcation and bulb. No cervical left ICA stenosis.  Normal left vertebral artery origin, the right is not well visualized. Slight dominance of the left vertebral artery. No vertebral artery stenosis elsewhere in the neck. Negative distal vertebral arteries, both PICA origins are  patent.  MRA HEAD FINDINGS  Antegrade flow in the posterior circulation with fairly codominant distal vertebral arteries. Normal PICA origins. Normal vertebrobasilar junction. No basilar artery irregularity or stenosis. SCA origins are within normal limits.  There is a fetal type right PCA origin, however the left PCA origin is conventional (there is a small left posterior communicating artery, and there is a short 3 mm segment of high-grade stenosis or occlusion in the left P1. See series 3, image 54.  Otherwise the bilateral PCA branches are within normal limits.  Antegrade flow in both ICA siphons. No siphon stenosis. Ophthalmic and posterior communicating artery origins are within normal limits. Normal carotid termini, MCA and ACA origins. Anterior communicating artery is diminutive or absent. Visualized bilateral ACA branches are within normal limits. Visualized bilateral MCA branches are within normal limits.  IMPRESSION: 1. Left PCA P1 short 3 mm segment of occlusion or high-grade stenosis corresponding to the acute left midbrain and thalamus striate artery territory infarct. 2. Otherwise negative intracranial and extracranial posterior circulation. 3. Right greater than left carotid bifurcation atherosclerosis, but no anterior circulation hemodynamically significant stenosis.  Electronically Signed: By: Augusto Gamble  M.D. On: 04/15/2014 13:54   [name] 04/15/2014   CLINICAL DATA:  58 year old female with small acute left thalamic and midbrain infarct detected on MRI for evaluation of diplopia. Initial encounter.  EXAM: MRA NECK WITHOUT AND WITH CONTRAST  MRA HEAD WITHOUT CONTRAST  TECHNIQUE: Multiplanar and multiecho pulse sequences of the neck were obtained without and with intravenous contrast. Angiographic images of the neck were obtained using MRA technique without and with intravenous contast.; Angiographic images of the Circle of Willis were obtained using MRA technique without intravenous contrast.  CONTRAST:  17mL MULTIHANCE GADOBENATE DIMEGLUMINE 529 MG/ML IV SOLN  COMPARISON:  Brain MRI without contrast 0644 hr the same day.  FINDINGS: MRA NECK FINDINGS  Pre contrast time-of-flight neck MRA images. Antegrade flow in both carotid and vertebral arteries in the neck. Fairly codominant vertebral arteries.  Post-contrast MRA images. Three vessel arch configuration with no great vessel origin stenosis.  Moderate irregularity at the right carotid bifurcation (series 29528, image 15) but with no hemodynamically significant stenosis at the right ICA origin or bulb. Negative cervical right ICA otherwise.  Minimal to mild irregularity at the left carotid bifurcation and bulb. No cervical left ICA stenosis.  Normal left vertebral artery origin, the right is not well visualized. Slight dominance of the left vertebral artery. No vertebral artery stenosis elsewhere in the neck. Negative distal vertebral arteries, both PICA origins are patent.  MRA HEAD FINDINGS  Antegrade flow in the posterior circulation with fairly codominant distal vertebral arteries. Normal PICA origins. Normal vertebrobasilar junction. No basilar artery irregularity or stenosis. SCA origins are within normal limits.  There is a fetal type right PCA origin, however the left PCA origin is conventional (there is a small left posterior  communicating artery, and there is a short 3 mm segment of high-grade stenosis or occlusion in the left P1. See series 3, image 54.  Otherwise the bilateral PCA branches are within normal limits.  Antegrade flow in both ICA siphons. No siphon stenosis. Ophthalmic and posterior communicating artery origins are within normal limits. Normal carotid termini, MCA and ACA origins. Anterior communicating artery is diminutive or absent. Visualized bilateral ACA branches are within normal limits. Visualized bilateral MCA branches are within normal limits.  IMPRESSION: 1. Left PCA P1 short 3 mm segment of occlusion or high-grade  stenosis corresponding to the acute left midbrain and thalamus striate artery territory infarct. 2. Otherwise negative intracranial and extracranial posterior circulation. 3. Right greater than left carotid bifurcation atherosclerosis, but no anterior circulation hemodynamically significant stenosis.  Electronically Signed: By: Augusto GambleLee  Hall M.D. On: 04/15/2014 13:54   Mr Brain Wo Contrast 04/15/2014   CLINICAL DATA:  Diplopia.  EXAM: MRI HEAD WITHOUT CONTRAST  TECHNIQUE: Multiplanar, multiecho pulse sequences of the brain and surrounding structures were obtained without intravenous contrast.  COMPARISON:  None.  FINDINGS: There are small foci of acute infarction in the left thalamus slightly extending into the left cerebral peduncle. There is no evidence of intracranial hemorrhage, mass, midline shift, or extra-axial fluid collection. Ventricles and sulci are within normal limits for age. Scattered, small foci of T2 hyperintensity in the subcortical and deep cerebral white matter bilaterally are nonspecific but compatible with minimal chronic small vessel ischemic disease.  Orbits are unremarkable. There is mild right anterior ethmoid air cell mucosal thickening. Small left mastoid effusion is noted. Major intracranial vascular flow voids are preserved.  IMPRESSION: 1. Acute left thalamic infarct. 2.  Minimal chronic small vessel ischemic disease.   Electronically Signed   By: Sebastian AcheAllen  Grady   On: 04/15/2014 07:44   12-lead ECG SR, rate 73, non-specific ST-T changes  Telemetry sinus rhythm with PAC's, PVC's  Assessment and Plan:  1. Cryptogenic stroke The patient presents with cryptogenic stroke.  The patient has a TEE planned for today.  I spoke at length with the patient about monitoring for afib with either a 30 day event monitor or an implantable loop recorder.  Risks, benefits, and alteratives to implantable loop recorder were discussed with the patient today.   At this time, the patient is very clear in their decision to proceed with implantable loop recorder.   Wound care was reviewed with the patient (keep incision clean and dry for 3 days).  Wound [facility] for [facility]-14 at [facility]  Please call with questions.

## 2014-04-17 NOTE — Interval H&P Note (Signed)
History and Physical Interval Note:  04/17/2014 1:39 PM  [name] CorollaJoan Eberlin  has presented today for surgery, with the diagnosis of stroke  The various methods of treatment have been discussed with the patient and family. After consideration of risks, benefits and other options for treatment, the patient has consented to  Procedure(s): TRANSESOPHAGEAL ECHOCARDIOGRAM (TEE) (N/A) as a surgical intervention .  The patient's history has been reviewed, patient examined, no change in status, stable for surgery.  I have reviewed the patient's chart and labs.  Questions were answered to the patient's satisfaction.     Lars MassonNELSON, Raad Clayson H

## 2014-04-18 ENCOUNTER — Encounter (HOSPITAL_COMMUNITY): Payer: Self-pay | Admitting: Cardiology

## 2014-04-25 ENCOUNTER — Encounter (HOSPITAL_COMMUNITY): Payer: Self-pay | Admitting: Internal Medicine

## 2014-04-29 ENCOUNTER — Ambulatory Visit: Payer: BC Managed Care – PPO

## 2014-05-07 ENCOUNTER — Encounter: Payer: Self-pay | Admitting: Internal Medicine

## 2014-05-16 ENCOUNTER — Ambulatory Visit (INDEPENDENT_AMBULATORY_CARE_PROVIDER_SITE_OTHER): Payer: BC Managed Care – PPO | Admitting: *Deleted

## 2014-05-16 ENCOUNTER — Encounter: Payer: Self-pay | Admitting: Internal Medicine

## 2014-05-16 DIAGNOSIS — I639 Cerebral infarction, unspecified: Secondary | ICD-10-CM

## 2014-05-16 LAB — MDC_IDC_ENUM_SESS_TYPE_REMOTE

## 2014-05-24 NOTE — Progress Notes (Signed)
Loop recorder 

## 2014-06-17 ENCOUNTER — Ambulatory Visit (INDEPENDENT_AMBULATORY_CARE_PROVIDER_SITE_OTHER): Payer: BLUE CROSS/BLUE SHIELD | Admitting: Nurse Practitioner

## 2014-06-17 ENCOUNTER — Encounter: Payer: Self-pay | Admitting: Nurse Practitioner

## 2014-06-17 ENCOUNTER — Ambulatory Visit (INDEPENDENT_AMBULATORY_CARE_PROVIDER_SITE_OTHER): Payer: BLUE CROSS/BLUE SHIELD | Admitting: *Deleted

## 2014-06-17 VITALS — BP 124/80 | HR 90 | Ht 62.0 in | Wt 170.0 lb

## 2014-06-17 DIAGNOSIS — E785 Hyperlipidemia, unspecified: Secondary | ICD-10-CM

## 2014-06-17 DIAGNOSIS — F1010 Alcohol abuse, uncomplicated: Secondary | ICD-10-CM | POA: Insufficient documentation

## 2014-06-17 DIAGNOSIS — I10 Essential (primary) hypertension: Secondary | ICD-10-CM

## 2014-06-17 DIAGNOSIS — I639 Cerebral infarction, unspecified: Secondary | ICD-10-CM

## 2014-06-17 LAB — MDC_IDC_ENUM_SESS_TYPE_REMOTE

## 2014-06-17 NOTE — Progress Notes (Signed)
Patient Name: [name] CorollaJoan Glantz Date of Encounter: 06/17/2014  Primary Care Provider:  Sissy HoffSWAYNE,DAVID W, MD Primary Cardiologist:  J. Allred, MD   Chief Complaint  59 year old female status post left thalamic stroke in late November followed by TEE and subsequent loop recorder placement who presents for follow-up.  Past Medical History   Past Medical History  Diagnosis Date  . Thyroid disease   . Hyperlipidemia   . Hypothyroidism   . PSVT (paroxysmal supraventricular tachycardia)   . Shingles   . Hypertension   . Stroke     a. 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 03/2014 Echo: EF 60-65%, Gr 1 DD;  c. 03/2014 TEE: EF 60-65%, mod LVH, no rwma, No PFO/ASD;  d. 03/2014 Carotid U/S: 1-39% bilat stenosis;  e. 04/2014 s/p MDT [id number] Reveal Linq (ser#: [id number]).  . Depression   . Anxiety   . ETOH abuse     a. 1 bottle of wine/day (03/2014).   Past Surgical History  Procedure Laterality Date  . Tubal ligation    . Tee without cardioversion N/A 04/17/2014    Procedure: TRANSESOPHAGEAL ECHOCARDIOGRAM (TEE);  Surgeon: Lars MassonKatarina H Nelson, MD;  [facility];  Service: Cardiovascular;  Laterality: N/A;  . Loop recorder implant  04-17-2014    MDT LINQ implanted by Dr Johney FrameAllred for cryptogenic stroke  . Loop recorder implant N/A 04/17/2014    Procedure: LOOP RECORDER IMPLANT;  Surgeon: Gardiner RhymeJames D Allred, MD;  Location: [facility] CATH LAB;  Service: Cardiovascular;  Laterality: N/A;    Allergies  Allergies  Allergen Reactions  . Amoxicillin Rash    HPI  59 year old female with the above problem list. She suffered a left thalamic stroke with horizontal diplopia in late November and [facility] Echocardiogram showed normal EF. Transesophageal echocardiogram did not show any evidence of a PFO or ASD. Carotid ultrasound showed 1-39% bilateral carotid stenosis. In the setting of cryptogenic stroke, a linq implantable loop recorder was placed prior to discharge.  Since  her discharge from the hospital, she reports that she has required next and no rehabilitation. Her vision returned to normal prior to hospital discharge. She did not have any strength loss in the setting of her stroke. She did have her device interrogated on December 31 showing normal device function. She was advised to follow-up for this appointment today at the time of her discharge. She reports compliance with her medications and stable blood pressures at home. She denies PND, orthopnea, dizziness, syncope, edema, palpitations, or early satiety.  Home Medications  Prior to Admission medications   Medication Sig Start Date End Date Taking? Authorizing Provider  ALPRAZolam Prudy Feeler(XANAX) 0.5 MG tablet Take 0.5 mg by mouth at bedtime as needed for anxiety.   Yes Historical Provider, MD  aspirin 81 MG chewable tablet Chew 1 tablet (81 mg total) by mouth daily. 04/17/14  Yes Costin Otelia SergeantM Gherghe, MD  atorvastatin (LIPITOR) 40 MG tablet Take 40 mg by mouth daily.   Yes Historical Provider, MD  DULoxetine (CYMBALTA) 60 MG capsule Take 60 mg by mouth daily.   Yes Historical Provider, MD  levothyroxine (SYNTHROID, LEVOTHROID) 112 MCG tablet Take 224 mcg by mouth daily.    Yes Historical Provider, MD  lisinopril-hydrochlorothiazide (PRINZIDE,ZESTORETIC) 20-25 MG per tablet Take 1 tablet by mouth daily.   Yes Historical Provider, MD  metoprolol succinate (TOPROL-XL) 100 MG 24 hr tablet Take 100 mg by mouth daily. Take with or immediately following a meal.   Yes Historical Provider,  MD    Review of SysSystems  As above, she is doing well.  She denies chest pain, palpitations, dyspnea, pnd, orthopnea, n, v, dizziness, syncope, edema, weight gain, or early satiety. All other systems reviewed and are otherwise negative except as noted above.  Physical Exam  VS:  BP 124/80 mmHg  Pulse 90  Ht  (1.575 m)  Wt 170 lb (77.111 kg)  BMI 31.09 kg/m2 , BMI Body mass index is 31.09 kg/(m^2). GEN: Well nourished, well  developed, in no acute distress. HEENT: normal. Neck: Supple, no JVD, carotid bruits, or masses. Cardiac: RRR, no murmurs, rubs, or gallops. No clubbing, cyanosis, edema.  Radials/DP/PT 2+ and equal bilaterally.  chest wall notable for well-healed area where loop recorder was placed over left sternal area, third to fourth intercostal space  Respiratory:  Respirations regular and unlabored, clear to auscultation bilaterally. GI: Soft, nontender, nondistended, BS + x 4. MS: no deformity or atrophy. Skin: warm and dry, no rash. Neuro:  Strength and sensation are intact. Psych: Normal affect.  Accessory Clinical Findings  Normal Loop monitor device function without evidence of arrhythmia.  Assessment & Plan  1.   Left thalamic stroke: Status post implantable loop recorder in December. No events noted on her monitor. She has recovered well from stroke without any sequelae. She has remote monitoring for her loop recorder at her bedside. We will arrange for follow-up with Dr. Johney Frame in one year.  2. Hypertension: Stable on current medications. She has been compliant.  3. Hyperlipidemia: On statin therapy. LDL was 118 in December with normal LFTs.  4. Disposition: Follow-up with Dr. Johney Frame in one year or sooner if necessary.  Nicolasa Ducking, NP 06/17/2014, 3:06 PM

## 2014-06-17 NOTE — Patient Instructions (Signed)
Your physician recommends that you continue on your current medications as directed. Please refer to the Current Medication list given to you today.    Your physician wants you to follow-up in:  One year with dr Johney Frameallred Bonita QuinYou will receive a reminder letter in the mail two months in advance. If you don't receive a letter, please call our office to schedule the follow-up appointment.

## 2014-06-18 NOTE — Progress Notes (Signed)
Loop recorder 

## 2014-06-20 ENCOUNTER — Encounter: Payer: Self-pay | Admitting: Internal Medicine

## 2014-07-17 ENCOUNTER — Ambulatory Visit (INDEPENDENT_AMBULATORY_CARE_PROVIDER_SITE_OTHER): Payer: BLUE CROSS/BLUE SHIELD | Admitting: *Deleted

## 2014-07-17 DIAGNOSIS — I639 Cerebral infarction, unspecified: Secondary | ICD-10-CM

## 2014-07-18 NOTE — Progress Notes (Signed)
Loop recorder 

## 2014-07-19 ENCOUNTER — Encounter: Payer: Self-pay | Admitting: Internal Medicine

## 2014-07-23 ENCOUNTER — Encounter: Payer: Self-pay | Admitting: *Deleted

## 2014-07-24 ENCOUNTER — Ambulatory Visit: Payer: Self-pay | Admitting: Neurology

## 2014-07-25 ENCOUNTER — Encounter: Payer: Self-pay | Admitting: Neurology

## 2014-08-02 LAB — MDC_IDC_ENUM_SESS_TYPE_REMOTE

## 2014-08-14 ENCOUNTER — Encounter: Payer: Self-pay | Admitting: Internal Medicine

## 2014-08-16 ENCOUNTER — Ambulatory Visit (INDEPENDENT_AMBULATORY_CARE_PROVIDER_SITE_OTHER): Payer: BLUE CROSS/BLUE SHIELD | Admitting: *Deleted

## 2014-08-16 DIAGNOSIS — I639 Cerebral infarction, unspecified: Secondary | ICD-10-CM

## 2014-08-16 NOTE — Progress Notes (Signed)
Loop recorder 

## 2014-09-11 LAB — MDC_IDC_ENUM_SESS_TYPE_REMOTE

## 2014-09-16 ENCOUNTER — Ambulatory Visit (INDEPENDENT_AMBULATORY_CARE_PROVIDER_SITE_OTHER): Payer: BLUE CROSS/BLUE SHIELD | Admitting: *Deleted

## 2014-09-16 DIAGNOSIS — I639 Cerebral infarction, unspecified: Secondary | ICD-10-CM | POA: Diagnosis not present

## 2014-09-18 NOTE — Progress Notes (Signed)
Loop recorder 

## 2014-09-27 ENCOUNTER — Encounter: Payer: Self-pay | Admitting: Internal Medicine

## 2014-10-03 LAB — CUP PACEART REMOTE DEVICE CHECK: Date Time Interrogation Session: 20160519113331

## 2014-10-15 ENCOUNTER — Ambulatory Visit (INDEPENDENT_AMBULATORY_CARE_PROVIDER_SITE_OTHER): Payer: BLUE CROSS/BLUE SHIELD | Admitting: *Deleted

## 2014-10-15 DIAGNOSIS — I639 Cerebral infarction, unspecified: Secondary | ICD-10-CM | POA: Diagnosis not present

## 2014-10-17 ENCOUNTER — Encounter: Payer: Self-pay | Admitting: Internal Medicine

## 2014-10-18 NOTE — Progress Notes (Signed)
Loop recorder 

## 2014-10-22 LAB — CUP PACEART REMOTE DEVICE CHECK: Date Time Interrogation Session: 20160607104132

## 2014-11-12 ENCOUNTER — Encounter: Payer: Self-pay | Admitting: Internal Medicine

## 2014-11-14 ENCOUNTER — Ambulatory Visit (INDEPENDENT_AMBULATORY_CARE_PROVIDER_SITE_OTHER): Payer: BLUE CROSS/BLUE SHIELD | Admitting: *Deleted

## 2014-11-14 DIAGNOSIS — I639 Cerebral infarction, unspecified: Secondary | ICD-10-CM | POA: Diagnosis not present

## 2014-11-14 NOTE — Progress Notes (Signed)
Loop recorder 

## 2014-11-21 LAB — CUP PACEART REMOTE DEVICE CHECK: Date Time Interrogation Session: 20160707151525

## 2014-12-04 ENCOUNTER — Encounter: Payer: Self-pay | Admitting: Internal Medicine

## 2014-12-13 ENCOUNTER — Ambulatory Visit (INDEPENDENT_AMBULATORY_CARE_PROVIDER_SITE_OTHER): Payer: BLUE CROSS/BLUE SHIELD | Admitting: *Deleted

## 2014-12-13 DIAGNOSIS — I639 Cerebral infarction, unspecified: Secondary | ICD-10-CM

## 2014-12-18 NOTE — Progress Notes (Signed)
Loop recorder 

## 2014-12-30 LAB — CUP PACEART REMOTE DEVICE CHECK: Date Time Interrogation Session: 20160815082601

## 2015-01-08 ENCOUNTER — Encounter: Payer: Self-pay | Admitting: Internal Medicine

## 2015-01-13 ENCOUNTER — Ambulatory Visit (INDEPENDENT_AMBULATORY_CARE_PROVIDER_SITE_OTHER): Payer: BLUE CROSS/BLUE SHIELD | Admitting: *Deleted

## 2015-01-13 DIAGNOSIS — I639 Cerebral infarction, unspecified: Secondary | ICD-10-CM | POA: Diagnosis not present

## 2015-01-15 NOTE — Progress Notes (Signed)
Loop recorder 

## 2015-01-24 LAB — CUP PACEART REMOTE DEVICE CHECK: Date Time Interrogation Session: 20160909124540

## 2015-01-24 NOTE — Progress Notes (Signed)
Carelink summary report received. Battery status OK. Normal device function. No new symptom episodes, tachy episodes, brady, or pause episodes. No new AF episodes. Monthly summary reports and ROV with JA in 06/2014.

## 2015-02-12 ENCOUNTER — Ambulatory Visit (INDEPENDENT_AMBULATORY_CARE_PROVIDER_SITE_OTHER): Payer: BLUE CROSS/BLUE SHIELD | Admitting: *Deleted

## 2015-02-12 DIAGNOSIS — I639 Cerebral infarction, unspecified: Secondary | ICD-10-CM

## 2015-02-13 LAB — CUP PACEART REMOTE DEVICE CHECK: Date Time Interrogation Session: 20160928060557

## 2015-02-13 NOTE — Progress Notes (Signed)
Loop recorder 

## 2015-02-13 NOTE — Progress Notes (Signed)
Carelink summary report received. Battery status OK. Normal device function. No new symptom episodes, tachy episodes, brady, or pause episodes. No new AF episodes. Monthly summary reports and ROV with JA in 05/2015. 

## 2015-02-17 ENCOUNTER — Encounter: Payer: Self-pay | Admitting: Internal Medicine

## 2015-03-06 ENCOUNTER — Encounter: Payer: Self-pay | Admitting: Internal Medicine

## 2015-03-14 ENCOUNTER — Ambulatory Visit (INDEPENDENT_AMBULATORY_CARE_PROVIDER_SITE_OTHER): Payer: BLUE CROSS/BLUE SHIELD | Admitting: *Deleted

## 2015-03-14 DIAGNOSIS — I6389 Other cerebral infarction: Secondary | ICD-10-CM

## 2015-03-14 DIAGNOSIS — I638 Other cerebral infarction: Secondary | ICD-10-CM | POA: Diagnosis not present

## 2015-03-14 NOTE — Progress Notes (Signed)
Loop recorder 

## 2015-04-11 LAB — CUP PACEART REMOTE DEVICE CHECK: Date Time Interrogation Session: 20161028063524

## 2015-04-11 NOTE — Progress Notes (Signed)
Carelink summary report received. Battery status OK. Normal device function. No new symptom episodes, tachy episodes, brady, or pause episodes. No new AF episodes. Monthly summary reports and ROV with JA in 05/2015 (recall letter sent).

## 2015-04-14 ENCOUNTER — Ambulatory Visit (INDEPENDENT_AMBULATORY_CARE_PROVIDER_SITE_OTHER): Payer: BLUE CROSS/BLUE SHIELD | Admitting: *Deleted

## 2015-04-14 DIAGNOSIS — I639 Cerebral infarction, unspecified: Secondary | ICD-10-CM | POA: Diagnosis not present

## 2015-04-17 NOTE — Progress Notes (Signed)
LOOP RECORDER  

## 2015-05-13 ENCOUNTER — Ambulatory Visit (INDEPENDENT_AMBULATORY_CARE_PROVIDER_SITE_OTHER): Payer: BLUE CROSS/BLUE SHIELD | Admitting: *Deleted

## 2015-05-13 DIAGNOSIS — I639 Cerebral infarction, unspecified: Secondary | ICD-10-CM | POA: Diagnosis not present

## 2015-05-13 NOTE — Progress Notes (Signed)
Carelink Summary Report / Loop Recorder 

## 2015-05-25 LAB — CUP PACEART REMOTE DEVICE CHECK: Date Time Interrogation Session: 20161127063531

## 2015-06-12 ENCOUNTER — Ambulatory Visit (INDEPENDENT_AMBULATORY_CARE_PROVIDER_SITE_OTHER): Payer: BLUE CROSS/BLUE SHIELD | Admitting: *Deleted

## 2015-06-12 DIAGNOSIS — I639 Cerebral infarction, unspecified: Secondary | ICD-10-CM | POA: Diagnosis not present

## 2015-06-12 NOTE — Progress Notes (Signed)
Carelink Summary Report / Loop Recorder 

## 2015-06-18 ENCOUNTER — Encounter: Payer: Self-pay | Admitting: *Deleted

## 2015-06-21 LAB — CUP PACEART REMOTE DEVICE CHECK: Date Time Interrogation Session: 20161227063521

## 2015-07-14 ENCOUNTER — Ambulatory Visit (INDEPENDENT_AMBULATORY_CARE_PROVIDER_SITE_OTHER): Payer: BLUE CROSS/BLUE SHIELD | Admitting: *Deleted

## 2015-07-14 DIAGNOSIS — I639 Cerebral infarction, unspecified: Secondary | ICD-10-CM

## 2015-07-14 NOTE — Progress Notes (Signed)
Carelink Summary Report / Loop Recorder 

## 2015-07-16 ENCOUNTER — Encounter: Payer: Self-pay | Admitting: *Deleted

## 2015-07-22 LAB — CUP PACEART REMOTE DEVICE CHECK: Date Time Interrogation Session: 20170126063546

## 2015-07-22 NOTE — Progress Notes (Signed)
Carelink summary report received. Battery status OK. Normal device function. No new symptom episodes, tachy episodes, brady, or pause episodes. No new AF episodes. Monthly summary reports and ROV/PRN 

## 2015-07-28 LAB — CUP PACEART REMOTE DEVICE CHECK: Date Time Interrogation Session: 20170225070522

## 2015-07-28 NOTE — Progress Notes (Signed)
Carelink summary report received. Battery status OK. Normal device function. No new symptom episodes, tachy episodes, brady, or pause episodes. No new AF episodes. Monthly summary reports and ROV/PRN 

## 2015-08-11 ENCOUNTER — Ambulatory Visit (INDEPENDENT_AMBULATORY_CARE_PROVIDER_SITE_OTHER): Payer: BLUE CROSS/BLUE SHIELD | Admitting: *Deleted

## 2015-08-11 DIAGNOSIS — I639 Cerebral infarction, unspecified: Secondary | ICD-10-CM

## 2015-08-11 NOTE — Progress Notes (Signed)
Carelink Summary Report / Loop Recorder 

## 2015-09-10 ENCOUNTER — Ambulatory Visit (INDEPENDENT_AMBULATORY_CARE_PROVIDER_SITE_OTHER): Payer: BLUE CROSS/BLUE SHIELD | Admitting: *Deleted

## 2015-09-10 DIAGNOSIS — I639 Cerebral infarction, unspecified: Secondary | ICD-10-CM

## 2015-09-10 NOTE — Progress Notes (Signed)
Carelink Summary Report / Loop Recorder 

## 2015-10-10 ENCOUNTER — Ambulatory Visit (INDEPENDENT_AMBULATORY_CARE_PROVIDER_SITE_OTHER): Payer: BLUE CROSS/BLUE SHIELD | Admitting: *Deleted

## 2015-10-10 DIAGNOSIS — I639 Cerebral infarction, unspecified: Secondary | ICD-10-CM | POA: Diagnosis not present

## 2015-10-10 NOTE — Progress Notes (Signed)
Carelink Summary Report / Loop Recorder 

## 2015-10-11 LAB — CUP PACEART REMOTE DEVICE CHECK: Date Time Interrogation Session: 20170327073528

## 2015-10-11 NOTE — Progress Notes (Signed)
Carelink summary report received. Battery status OK. Normal device function. No new symptom episodes, tachy episodes, brady, or pause episodes. No new AF episodes. Monthly summary reports and ROV/PRN 

## 2015-10-13 LAB — CUP PACEART REMOTE DEVICE CHECK: Date Time Interrogation Session: 20170426080707

## 2015-11-10 ENCOUNTER — Ambulatory Visit (INDEPENDENT_AMBULATORY_CARE_PROVIDER_SITE_OTHER): Payer: BLUE CROSS/BLUE SHIELD | Admitting: *Deleted

## 2015-11-10 DIAGNOSIS — I639 Cerebral infarction, unspecified: Secondary | ICD-10-CM | POA: Diagnosis not present

## 2015-11-10 NOTE — Progress Notes (Signed)
Carelink Summary Report / Loop Recorder 

## 2015-11-13 LAB — CUP PACEART REMOTE DEVICE CHECK: Date Time Interrogation Session: 20170526083628

## 2015-11-28 LAB — CUP PACEART REMOTE DEVICE CHECK: MDC IDC SESS DTM: 20170625090521

## 2015-12-09 ENCOUNTER — Ambulatory Visit (INDEPENDENT_AMBULATORY_CARE_PROVIDER_SITE_OTHER): Payer: BLUE CROSS/BLUE SHIELD | Admitting: *Deleted

## 2015-12-09 DIAGNOSIS — I639 Cerebral infarction, unspecified: Secondary | ICD-10-CM

## 2015-12-10 NOTE — Progress Notes (Signed)
Carelink Summary Report / Loop Recorder 

## 2015-12-26 LAB — CUP PACEART REMOTE DEVICE CHECK: Date Time Interrogation Session: 20170725090559

## 2016-01-08 ENCOUNTER — Ambulatory Visit (INDEPENDENT_AMBULATORY_CARE_PROVIDER_SITE_OTHER): Payer: BLUE CROSS/BLUE SHIELD | Admitting: *Deleted

## 2016-01-08 DIAGNOSIS — I639 Cerebral infarction, unspecified: Secondary | ICD-10-CM | POA: Diagnosis not present

## 2016-01-08 NOTE — Progress Notes (Signed)
Carelink Summary Report / Loop Recorder 

## 2016-02-07 LAB — CUP PACEART REMOTE DEVICE CHECK: MDC IDC SESS DTM: 20170824093535

## 2016-02-07 NOTE — Progress Notes (Signed)
Carelink summary report received. Battery status OK. Normal device function. No new symptom episodes, tachy episodes, brady, or pause episodes. No new AF episodes. Monthly summary reports and ROV/PRN 

## 2016-02-09 ENCOUNTER — Ambulatory Visit (INDEPENDENT_AMBULATORY_CARE_PROVIDER_SITE_OTHER): Payer: BLUE CROSS/BLUE SHIELD | Admitting: *Deleted

## 2016-02-09 DIAGNOSIS — I639 Cerebral infarction, unspecified: Secondary | ICD-10-CM

## 2016-02-09 NOTE — Progress Notes (Signed)
Carelink Summary Report / Loop Recorder 

## 2016-03-08 ENCOUNTER — Ambulatory Visit (INDEPENDENT_AMBULATORY_CARE_PROVIDER_SITE_OTHER): Payer: BLUE CROSS/BLUE SHIELD | Admitting: *Deleted

## 2016-03-08 DIAGNOSIS — I639 Cerebral infarction, unspecified: Secondary | ICD-10-CM | POA: Diagnosis not present

## 2016-03-08 NOTE — Progress Notes (Signed)
Carelink Summary Report / Loop Recorder 

## 2016-03-13 LAB — CUP PACEART REMOTE DEVICE CHECK: Date Time Interrogation Session: 20170923093519

## 2016-03-13 NOTE — Progress Notes (Signed)
Carelink summary report received. Battery status OK. Normal device function. No new symptom episodes, tachy episodes, brady, or pause episodes. No new AF episodes. Monthly summary reports and ROV/PRN 

## 2016-04-03 LAB — CUP PACEART REMOTE DEVICE CHECK
Date Time Interrogation Session: 20171023113743
MDC IDC PG IMPLANT DT: 20151202

## 2016-04-03 NOTE — Progress Notes (Signed)
Carelink summary report received. Battery status OK. Normal device function. No new symptom episodes, tachy episodes, brady, or pause episodes. No new AF episodes. Monthly summary reports and ROV/PRN 

## 2016-04-07 ENCOUNTER — Ambulatory Visit (INDEPENDENT_AMBULATORY_CARE_PROVIDER_SITE_OTHER): Payer: BLUE CROSS/BLUE SHIELD | Admitting: *Deleted

## 2016-04-07 DIAGNOSIS — I639 Cerebral infarction, unspecified: Secondary | ICD-10-CM

## 2016-04-07 NOTE — Progress Notes (Signed)
Carelink Summary Report / Loop Recorder 

## 2016-05-07 ENCOUNTER — Ambulatory Visit (INDEPENDENT_AMBULATORY_CARE_PROVIDER_SITE_OTHER): Payer: Self-pay | Admitting: *Deleted

## 2016-05-07 DIAGNOSIS — I639 Cerebral infarction, unspecified: Secondary | ICD-10-CM

## 2016-05-07 NOTE — Progress Notes (Signed)
Carelink Summary Report / Loop Recorder 

## 2016-05-21 LAB — CUP PACEART REMOTE DEVICE CHECK
Date Time Interrogation Session: 20171122123639
MDC IDC PG IMPLANT DT: 20151202

## 2016-06-07 ENCOUNTER — Ambulatory Visit (INDEPENDENT_AMBULATORY_CARE_PROVIDER_SITE_OTHER): Payer: Self-pay | Admitting: *Deleted

## 2016-06-07 DIAGNOSIS — I639 Cerebral infarction, unspecified: Secondary | ICD-10-CM

## 2016-06-08 NOTE — Progress Notes (Signed)
Carelink Summary Report / Loop Recorder 

## 2016-06-24 LAB — CUP PACEART REMOTE DEVICE CHECK
Date Time Interrogation Session: 20171222123800
Implantable Pulse Generator Implant Date: 20151202

## 2016-07-06 ENCOUNTER — Ambulatory Visit (INDEPENDENT_AMBULATORY_CARE_PROVIDER_SITE_OTHER): Payer: Self-pay | Admitting: *Deleted

## 2016-07-06 DIAGNOSIS — I639 Cerebral infarction, unspecified: Secondary | ICD-10-CM

## 2016-07-06 NOTE — Progress Notes (Signed)
Carelink Summary Report / Loop Recorder 

## 2016-07-10 LAB — CUP PACEART REMOTE DEVICE CHECK
Date Time Interrogation Session: 20180121123921
MDC IDC PG IMPLANT DT: 20151202

## 2016-07-27 LAB — CUP PACEART REMOTE DEVICE CHECK
Date Time Interrogation Session: 20180220130821
MDC IDC PG IMPLANT DT: 20151202

## 2016-08-05 ENCOUNTER — Ambulatory Visit (INDEPENDENT_AMBULATORY_CARE_PROVIDER_SITE_OTHER): Payer: Self-pay | Admitting: *Deleted

## 2016-08-05 DIAGNOSIS — I639 Cerebral infarction, unspecified: Secondary | ICD-10-CM

## 2016-08-05 NOTE — Progress Notes (Signed)
Carelink Summary Report / Loop Recorder 

## 2016-08-16 LAB — CUP PACEART REMOTE DEVICE CHECK
Implantable Pulse Generator Implant Date: 20151202
MDC IDC SESS DTM: 20180322133807

## 2016-09-06 ENCOUNTER — Ambulatory Visit (INDEPENDENT_AMBULATORY_CARE_PROVIDER_SITE_OTHER): Payer: Self-pay | Admitting: *Deleted

## 2016-09-06 DIAGNOSIS — I639 Cerebral infarction, unspecified: Secondary | ICD-10-CM

## 2016-09-06 NOTE — Progress Notes (Signed)
Carelink Summary Report / Loop Recorder 

## 2016-09-24 LAB — CUP PACEART REMOTE DEVICE CHECK
Implantable Pulse Generator Implant Date: 20151202
MDC IDC SESS DTM: 20180421133901

## 2016-10-04 ENCOUNTER — Ambulatory Visit (INDEPENDENT_AMBULATORY_CARE_PROVIDER_SITE_OTHER): Payer: Self-pay | Admitting: *Deleted

## 2016-10-04 DIAGNOSIS — I639 Cerebral infarction, unspecified: Secondary | ICD-10-CM

## 2016-10-04 NOTE — Progress Notes (Signed)
Carelink Summary Report / Loop Recorder 

## 2016-10-07 LAB — CUP PACEART REMOTE DEVICE CHECK
Implantable Pulse Generator Implant Date: 20151202
MDC IDC SESS DTM: 20180521133752

## 2016-11-03 ENCOUNTER — Ambulatory Visit (INDEPENDENT_AMBULATORY_CARE_PROVIDER_SITE_OTHER): Payer: Self-pay | Admitting: *Deleted

## 2016-11-03 DIAGNOSIS — I639 Cerebral infarction, unspecified: Secondary | ICD-10-CM

## 2016-11-04 NOTE — Progress Notes (Signed)
Carelink Summary Report / Loop Recorder 

## 2016-11-10 LAB — CUP PACEART REMOTE DEVICE CHECK
Date Time Interrogation Session: 20180620134107
Implantable Pulse Generator Implant Date: 20151202

## 2016-12-03 ENCOUNTER — Ambulatory Visit (INDEPENDENT_AMBULATORY_CARE_PROVIDER_SITE_OTHER): Payer: Self-pay | Admitting: *Deleted

## 2016-12-03 DIAGNOSIS — I639 Cerebral infarction, unspecified: Secondary | ICD-10-CM

## 2016-12-07 NOTE — Progress Notes (Signed)
Carelink Summary Report / Loop Recorder 

## 2016-12-18 LAB — CUP PACEART REMOTE DEVICE CHECK
Date Time Interrogation Session: 20180720184637
Implantable Pulse Generator Implant Date: 20151202

## 2016-12-18 NOTE — Progress Notes (Signed)
Carelink summary report received. Battery status OK. Normal device function. No new symptom episodes, tachy episodes, brady, or pause episodes. No new AF episodes. Monthly summary reports and ROV/PRN 

## 2017-01-03 ENCOUNTER — Ambulatory Visit (INDEPENDENT_AMBULATORY_CARE_PROVIDER_SITE_OTHER): Payer: Self-pay | Admitting: *Deleted

## 2017-01-03 DIAGNOSIS — I639 Cerebral infarction, unspecified: Secondary | ICD-10-CM

## 2017-01-04 NOTE — Progress Notes (Signed)
Carelink Summary Report / Loop Recorder 

## 2017-01-09 LAB — CUP PACEART REMOTE DEVICE CHECK
Date Time Interrogation Session: 20180819193727
Implantable Pulse Generator Implant Date: 20151202

## 2017-02-01 ENCOUNTER — Ambulatory Visit (INDEPENDENT_AMBULATORY_CARE_PROVIDER_SITE_OTHER): Payer: Self-pay | Admitting: *Deleted

## 2017-02-01 DIAGNOSIS — I639 Cerebral infarction, unspecified: Secondary | ICD-10-CM

## 2017-02-02 NOTE — Progress Notes (Signed)
Carelink Summary Report / Loop Recorder 

## 2017-02-03 LAB — CUP PACEART REMOTE DEVICE CHECK
Date Time Interrogation Session: 20180918200723
MDC IDC PG IMPLANT DT: 20151202

## 2017-02-24 ENCOUNTER — Telehealth: Payer: Self-pay | Admitting: *Deleted

## 2017-02-24 NOTE — Telephone Encounter (Signed)
Attempted to reach patient regarding LINQ at RRT as of 02/23/17.  Software update is necessary but likely true RRT as LINQ was implanted 02/15/14.  VM is full, unable to leave message.  No DPR on file.  Left generic message on identified VM for Jabrea [facility], other) requesting call back from patient.  Gave Device Clinic phone number.  Attempted to reach Abby [facility], daughter), VM is not identified so did not leave a message.

## 2017-03-03 ENCOUNTER — Ambulatory Visit (INDEPENDENT_AMBULATORY_CARE_PROVIDER_SITE_OTHER): Payer: Self-pay | Admitting: *Deleted

## 2017-03-03 DIAGNOSIS — I639 Cerebral infarction, unspecified: Secondary | ICD-10-CM

## 2017-03-04 LAB — CUP PACEART REMOTE DEVICE CHECK
Implantable Pulse Generator Implant Date: 20151202
MDC IDC SESS DTM: 20181018203926

## 2017-03-04 NOTE — Telephone Encounter (Signed)
LMOVM requesting call back to Device Clinic. Gave device Clinic phone number.

## 2017-03-04 NOTE — Progress Notes (Signed)
Carelink Summary Report / Loop Recorder 

## 2017-03-11 NOTE — Telephone Encounter (Signed)
Spoke with patient regarding LINQ nearing RRT.  Per Carelink, software needs to be updated but true RRT is also possible (implanted 04/17/14).  Patient states she does not have health insurance so she declines an appointment with Dr. Johney FrameAllred to discuss explant.  She is aware that she can call back at a later date if she wishes to discuss it in the future.  Attempted to assist patient with sending a manual transmission but she received error code indicating that monitor needed to charge.  Patient is agreeable to receiving a call back later this afternoon for further assistance.

## 2017-03-11 NOTE — Telephone Encounter (Signed)
LMOVM requesting return call to the Device Clinic.  Gave direct phone number for return call.   

## 2017-03-15 NOTE — Telephone Encounter (Signed)
LMOVM requesting return call to the Device Clinic.  Gave direct phone number for return call.

## 2017-03-24 ENCOUNTER — Encounter: Payer: Self-pay | Admitting: *Deleted

## 2017-03-24 NOTE — Telephone Encounter (Signed)
Patient has not returned calls x3, manual transmission still not received (to force software update for possibly false RRT).  Letter and monitor instructions mailed to patient.

## 2017-04-04 ENCOUNTER — Ambulatory Visit (INDEPENDENT_AMBULATORY_CARE_PROVIDER_SITE_OTHER): Payer: Self-pay | Admitting: *Deleted

## 2017-04-04 DIAGNOSIS — I639 Cerebral infarction, unspecified: Secondary | ICD-10-CM

## 2017-04-05 ENCOUNTER — Telehealth: Payer: Self-pay

## 2017-04-05 ENCOUNTER — Telehealth: Payer: Self-pay | Admitting: Cardiology

## 2017-04-05 NOTE — Telephone Encounter (Signed)
Spoke w/ pt and requested that she send a manual transmission b/c her home monitor has not updated in at least 14 days.   

## 2017-04-05 NOTE — Progress Notes (Signed)
Carelink Summary Report / Loop Recorder 

## 2017-04-05 NOTE — Telephone Encounter (Signed)
Patient called and stated that last night. She developed cold sweats, and nausea. She laid down and it went away. She did not used her symptom activator. Pt sent a manual transmission. Informed her that the Device Tech RN will review the transmission and call her back w/ the results.

## 2017-04-05 NOTE — Telephone Encounter (Signed)
Spoke with patient who reports cold sweats and nausea. I explained that there were no episodes on her ILR. She states that she has not taken her BP medication in a couple of days and this usually happens. I explained that the ILR does not show her BP only the electrical activity of her heart. She verbalized understanding.

## 2017-04-11 NOTE — Telephone Encounter (Signed)
Manual transmission received on 04/05/17--false RRT, software successfully updated.

## 2017-04-19 LAB — CUP PACEART REMOTE DEVICE CHECK
Implantable Pulse Generator Implant Date: 20151202
MDC IDC SESS DTM: 20181117231046

## 2017-05-02 ENCOUNTER — Ambulatory Visit (INDEPENDENT_AMBULATORY_CARE_PROVIDER_SITE_OTHER): Payer: Self-pay | Admitting: *Deleted

## 2017-05-02 DIAGNOSIS — I639 Cerebral infarction, unspecified: Secondary | ICD-10-CM

## 2017-05-03 NOTE — Progress Notes (Signed)
Carelink Summary Report / Loop Recorder 

## 2017-05-11 ENCOUNTER — Other Ambulatory Visit: Payer: Self-pay | Admitting: Internal Medicine

## 2017-05-12 LAB — CUP PACEART REMOTE DEVICE CHECK
Date Time Interrogation Session: 20181217233900
MDC IDC PG IMPLANT DT: 20151202

## 2017-06-01 ENCOUNTER — Ambulatory Visit (INDEPENDENT_AMBULATORY_CARE_PROVIDER_SITE_OTHER): Payer: Self-pay | Admitting: *Deleted

## 2017-06-01 DIAGNOSIS — I639 Cerebral infarction, unspecified: Secondary | ICD-10-CM

## 2017-06-02 NOTE — Progress Notes (Signed)
Carelink Summary Report / Loop Recorder 

## 2017-06-10 LAB — CUP PACEART REMOTE DEVICE CHECK
Implantable Pulse Generator Implant Date: 20151202
MDC IDC SESS DTM: 20190117004023

## 2017-07-01 ENCOUNTER — Ambulatory Visit (INDEPENDENT_AMBULATORY_CARE_PROVIDER_SITE_OTHER): Payer: Self-pay | Admitting: *Deleted

## 2017-07-01 DIAGNOSIS — I639 Cerebral infarction, unspecified: Secondary | ICD-10-CM

## 2017-07-04 NOTE — Progress Notes (Signed)
Carelink Summary Report / Loop Recorder 

## 2017-07-28 ENCOUNTER — Telehealth: Payer: Self-pay | Admitting: *Deleted

## 2017-07-28 NOTE — Telephone Encounter (Signed)
LMOVM requesting call back to the Device Clinic.  Gave direct number.  LINQ at RRT as of 07/24/17.

## 2017-08-01 LAB — CUP PACEART REMOTE DEVICE CHECK
Date Time Interrogation Session: 20190216010956
Implantable Pulse Generator Implant Date: 20151202

## 2017-08-03 ENCOUNTER — Ambulatory Visit (INDEPENDENT_AMBULATORY_CARE_PROVIDER_SITE_OTHER): Payer: Self-pay | Admitting: *Deleted

## 2017-08-03 DIAGNOSIS — I639 Cerebral infarction, unspecified: Secondary | ICD-10-CM

## 2017-08-04 NOTE — Progress Notes (Signed)
Carelink Summary Report / Loop Recorder 

## 2017-08-08 NOTE — Telephone Encounter (Signed)
LMOVM requesting call back to the Device Clinic. Gave direct number. 

## 2017-08-25 ENCOUNTER — Encounter: Payer: Self-pay | Admitting: *Deleted

## 2017-08-25 NOTE — Telephone Encounter (Signed)
Attempted to reach patient regarding LINQ at RRT as of 07/24/17.  VM full, unable to LM.  Mailed letter with patient's options.  Unenrolled from [place], return kit ordered.

## 2017-09-11 LAB — CUP PACEART REMOTE DEVICE CHECK
Date Time Interrogation Session: 20190321013745
MDC IDC PG IMPLANT DT: 20151202

## 2017-09-22 ENCOUNTER — Other Ambulatory Visit: Payer: Self-pay | Admitting: Internal Medicine

## 2020-06-13 ENCOUNTER — Encounter: Payer: Self-pay | Admitting: Internal Medicine

## 2020-07-07 ENCOUNTER — Other Ambulatory Visit: Payer: Self-pay

## 2020-07-07 ENCOUNTER — Encounter: Payer: Self-pay | Admitting: Internal Medicine

## 2020-07-07 ENCOUNTER — Ambulatory Visit (INDEPENDENT_AMBULATORY_CARE_PROVIDER_SITE_OTHER): Payer: Self-pay | Admitting: Internal Medicine

## 2020-07-07 VITALS — BP 134/96 | HR 77 | Ht 62.0 in | Wt 176.0 lb

## 2020-07-07 DIAGNOSIS — I639 Cerebral infarction, unspecified: Secondary | ICD-10-CM

## 2020-07-07 DIAGNOSIS — E7800 Pure hypercholesterolemia, unspecified: Secondary | ICD-10-CM

## 2020-07-07 DIAGNOSIS — I10 Essential (primary) hypertension: Secondary | ICD-10-CM

## 2020-07-07 HISTORY — PX: OTHER SURGICAL HISTORY: SHX169

## 2020-07-07 NOTE — Patient Instructions (Signed)
Medication Instructions:  Your physician recommends that you continue on your current medications as directed. Please refer to the Current Medication list given to you today.  Labwork: None ordered.  Testing/Procedures: None ordered.   Your physician wants you to follow-up in: as needed with Dr. Allred.     Implantable Loop Recorder Removal, Care After This sheet gives you information about how to care for yourself after your procedure. Your health care provider may also give you more specific instructions. If you have problems or questions, contact your health care provider. What can I expect after the procedure? After the procedure, it is common to have:  Soreness or discomfort near the incision.  Some swelling or bruising near the incision.  Follow these instructions at home: Incision care  1.  Leave your outer dressing on for 24 hours.  After 24 hours you can remove your outer dressing and shower. 2. Leave adhesive strips in place. These skin closures may need to stay in place for 1-2 weeks. If adhesive strip edges start to loosen and curl up, you may trim the loose edges.  You may remove the strips if they have not fallen off after 2 weeks. 3. Check your incision area every day for signs of infection. Check for: a. Redness, swelling, or pain. b. Fluid or blood. c. Warmth. d. Pus or a bad smell. 4. Do not take baths, swim, or use a hot tub until your incision is completely healed. 5. If your wound site starts to bleed apply pressure.      If you have any questions/concerns please call the device clinic at [phone].  Activity  Return to your normal activities.  Contact a health care provider if:  You have redness, swelling, or pain around your incision.  You have a fever.   

## 2020-07-07 NOTE — Progress Notes (Signed)
PCP: Tally Joe, MD   Primary EP: Dr Johney Frame  [name] Wagner is a 65 y.o. female who presents today for routine electrophysiology followup.  She had ILR implanted for cryptogenic stroke indication in 2015.  She was monitored without AF detected.  Her device is at EOS.  She presents today to have this removed.  Today, she denies symptoms of palpitations, chest pain, shortness of breath,  lower extremity edema, dizziness, presyncope, or syncope.  The patient is otherwise without complaint today.   Past Medical History:  Diagnosis Date  . Anxiety   . Depression   . ETOH abuse    a. 1 bottle of wine/day (03/2014).  . Hyperlipidemia   . Hypertension   . Hypothyroidism   . PSVT (paroxysmal supraventricular tachycardia) (HCC)   . Seasonal allergies   . Shingles    AGE 77  . Stroke (HCC)    a. 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 03/2014 Echo: EF 60-65%, Gr 1 DD;  c. 03/2014 TEE: EF 60-65%, mod LVH, no rwma, No PFO/ASD;  d. 03/2014 Carotid U/S: 1-39% bilat stenosis;  e. 04/2014 s/p MDT [id number] Reveal Linq (ser#: [id number]).  . Thyroid disease    Past Surgical History:  Procedure Laterality Date  . LOOP RECORDER IMPLANT  04-17-2014   MDT LINQ implanted by Dr Johney Frame for cryptogenic stroke  . LOOP RECORDER IMPLANT N/A 04/17/2014   Procedure: LOOP RECORDER IMPLANT;  Surgeon: Gardiner Rhyme, MD;  Location: [facility] CATH LAB;  Service: Cardiovascular;  Laterality: N/A;  . TEE WITHOUT CARDIOVERSION N/A 04/17/2014   Procedure: TRANSESOPHAGEAL ECHOCARDIOGRAM (TEE);  Surgeon: Lars Masson, MD;  Location: [facility] ENDOSCOPY;  Service: Cardiovascular;  Laterality: N/A;  . TUBAL LIGATION      ROS- all systems are reviewed and negatives except as per HPI above  Current Outpatient Medications  Medication Sig Dispense Refill  . ALPRAZolam (XANAX) 0.5 MG tablet Take 0.5 mg by mouth at bedtime as needed for anxiety.    Marland Kitchen aspirin 81 MG chewable tablet Chew 1 tablet (81 mg total) by mouth daily.  30 tablet 2  . atorvastatin (LIPITOR) 80 MG tablet Take 80 mg by mouth daily.    . DULoxetine (CYMBALTA) 60 MG capsule Take 60 mg by mouth daily.    Marland Kitchen ezetimibe (ZETIA) 10 MG tablet Take 10 mg by mouth daily.    Marland Kitchen levothyroxine (SYNTHROID) 125 MCG tablet Take 1 tablet by mouth daily.    Marland Kitchen lisinopril-hydrochlorothiazide (PRINZIDE,ZESTORETIC) 20-25 MG per tablet Take 1 tablet by mouth daily.    . metoprolol succinate (TOPROL-XL) 100 MG 24 hr tablet Take 100 mg by mouth daily. Take with or immediately following a meal.     No current facility-administered medications for this visit.    Physical Exam: Vitals:   07/07/20 1049  BP: (!) 134/96  Pulse: 77  SpO2: 96%  Weight: 176 lb (79.8 kg)  Height: 5\' 2"  (1.575 m)    GEN- The patient is well appearing, alert and oriented x 3 today.   Head- normocephalic, atraumatic Eyes-  Sclera clear, conjunctiva pink Ears- hearing intact Oropharynx- clear Lungs- Clear to ausculation bilaterally, normal work of breathing Heart- Regular rate and rhythm, no murmurs, rubs or gallops, PMI not laterally displaced GI- soft, NT, ND, + BS Extremities- no clubbing, cyanosis, or edema  Wt Readings from Last 3 Encounters:  07/07/20 176 lb (79.8 kg)  06/17/14 170 lb (77.1 kg)  04/17/14 176 lb (79.8 kg)    EKG  tracing ordered today is personally reviewed and shows sinus  Assessment and Plan:  1. HTN Stable No change required today  2. Prior stroke Her ILR is at EOS The device is not functional.  She is clear that she would like to have it removed. Risks of ILR removal including but not limited to bleeding and infection were discussed with the patient who wishes to proceed.  3. HL Stable No change required today   Risks, benefits and potential toxicities for medications prescribed and/or refilled reviewed with patient today.   Return as needed  Hillis Range MD, [facility] 07/07/2020 10:55 AM      PROCEDURES:   1. Implantable loop recorder  explantation     DESCRIPTION OF PROCEDURE:  Informed written consent was obtained.  The patient required no sedation for the procedure today.   The patients left chest was therefore prepped and draped in the usual sterile fashion.  The skin overlying the ILR monitor was infiltrated with lidocaine for local analgesia.  A 0.5-cm incision was made over the site.  The previously implanted ILR was exposed and removed using a combination of sharp and blunt dissection.  Steri- Strips and a sterile dressing were then applied. EBL<68ml.  There were no early apparent complications.     CONCLUSIONS:   1. Successful explantation of a [employer] Reveal LINQ implantable loop recorder   2. No early apparent complications.        Hillis Range MD, [facility] 07/07/2020 10:57 AM

## 2021-03-07 ENCOUNTER — Emergency Department (HOSPITAL_COMMUNITY)
Admission: EM | Admit: 2021-03-07 | Discharge: 2021-03-07 | Disposition: A | Discharge status: Home / Self Care | Payer: 59 | Attending: Emergency Medicine | Admitting: Emergency Medicine

## 2021-03-07 ENCOUNTER — Other Ambulatory Visit: Payer: Self-pay

## 2021-03-07 ENCOUNTER — Encounter (HOSPITAL_COMMUNITY): Payer: Self-pay

## 2021-03-07 ENCOUNTER — Emergency Department (HOSPITAL_COMMUNITY): Payer: 59

## 2021-03-07 DIAGNOSIS — R42 Dizziness and giddiness: Secondary | ICD-10-CM | POA: Insufficient documentation

## 2021-03-07 DIAGNOSIS — E039 Hypothyroidism, unspecified: Secondary | ICD-10-CM | POA: Insufficient documentation

## 2021-03-07 DIAGNOSIS — Z7982 Long term (current) use of aspirin: Secondary | ICD-10-CM | POA: Diagnosis not present

## 2021-03-07 DIAGNOSIS — I10 Essential (primary) hypertension: Secondary | ICD-10-CM | POA: Diagnosis not present

## 2021-03-07 DIAGNOSIS — Z20822 Contact with and (suspected) exposure to covid-19: Secondary | ICD-10-CM | POA: Insufficient documentation

## 2021-03-07 DIAGNOSIS — Z79899 Other long term (current) drug therapy: Secondary | ICD-10-CM | POA: Diagnosis not present

## 2021-03-07 LAB — CBC WITH DIFFERENTIAL/PLATELET
Abs Immature Granulocytes: 0.03 10*3/uL (ref 0.00–0.07)
Basophils Absolute: 0.1 10*3/uL (ref 0.0–0.1)
Basophils Relative: 1 %
Eosinophils Absolute: 0 10*3/uL (ref 0.0–0.5)
Eosinophils Relative: 0 %
HCT: 38.8 % (ref 36.0–46.0)
Hemoglobin: 13.2 g/dL (ref 12.0–15.0)
Immature Granulocytes: 0 %
Lymphocytes Relative: 25 %
Lymphs Abs: 2.1 10*3/uL (ref 0.7–4.0)
MCH: 29.2 pg (ref 26.0–34.0)
MCHC: 34 g/dL (ref 30.0–36.0)
MCV: 85.8 fL (ref 80.0–100.0)
Monocytes Absolute: 0.8 10*3/uL (ref 0.1–1.0)
Monocytes Relative: 9 %
Neutro Abs: 5.4 10*3/uL (ref 1.7–7.7)
Neutrophils Relative %: 65 %
Platelets: 296 10*3/uL (ref 150–400)
RBC: 4.52 MIL/uL (ref 3.87–5.11)
RDW: 12.4 % (ref 11.5–15.5)
WBC: 8.3 10*3/uL (ref 4.0–10.5)
nRBC: 0 % (ref 0.0–0.2)

## 2021-03-07 LAB — BASIC METABOLIC PANEL
Anion gap: 10 (ref 5–15)
BUN: 15 mg/dL (ref 8–23)
CO2: 28 mmol/L (ref 22–32)
Calcium: 9.5 mg/dL (ref 8.9–10.3)
Chloride: 102 mmol/L (ref 98–111)
Creatinine, Ser: 0.67 mg/dL (ref 0.44–1.00)
GFR, Estimated: >60 mL/min (ref >60)
Glucose, Bld: 119 mg/dL — ABNORMAL HIGH (ref 70–99)
Potassium: 3.4 mmol/L — ABNORMAL LOW (ref 3.5–5.1)
Sodium: 140 mmol/L (ref 135–145)

## 2021-03-07 LAB — RESP PANEL BY RT-PCR (FLU A&B, COVID) ARPGX2
Influenza A by PCR: NEGATIVE
Influenza B by PCR: NEGATIVE
SARS Coronavirus 2 by RT PCR: NEGATIVE

## 2021-03-07 NOTE — ED Triage Notes (Signed)
Pt states she woke up this am at 11am and was feeling dizzy. Denies any nausea and vomiting. States she is having it intermittently now.

## 2021-03-07 NOTE — Discharge Instructions (Signed)
You were seen in the emergency department for evaluation of dizziness.  You had lab work done along with a CAT scan of your head that did not show an obvious explanation for your symptoms.  Your symptoms improved while you were here.  Please contact your primary care doctor for close follow-up.  Return to the emergency department if any worsening or concerning symptoms

## 2021-03-07 NOTE — ED Provider Notes (Signed)
[facility] EMERGENCY DEPARTMENT Provider Note   CSN: [id number] Arrival date & time: 03/07/21  1549     History Chief Complaint  Patient presents with   Dizziness    [name] Wagner is a 65 y.o. female.  She is here with a complaint of dizziness since she woke up around 11 AM this morning.  It does not occur when she is laying down or when she moves her head or eyes.  She only feels it when she gets from a sitting to a standing position.  Seems to last for few seconds and then it will settle down again.  She said she had an episode of this last week that resolved on its own.  She does not feel like she is dehydrated.  She is afraid she may have COVID or is having a stroke.  She said she had a stroke in the past that caused her to have blurry vision.  She denies any blurry vision double vision numbness weakness.  The history is provided by the patient.  Dizziness Quality:  Lightheadedness and head spinning Severity:  Moderate Onset quality:  Gradual Duration:  5 hours Timing:  Intermittent Progression:  Unchanged Chronicity:  Recurrent Context: standing up   Context: not with loss of consciousness   Relieved by:  Lying down Worsened by:  Standing up Ineffective treatments:  None tried Associated symptoms: no chest pain, no diarrhea, no headaches, no hearing loss, no nausea, no shortness of breath, no tinnitus, no vision changes, no vomiting and no weakness       Past Medical History:  Diagnosis Date   Anxiety    Depression    ETOH abuse    a. 1 bottle of wine/day (03/2014).   Hyperlipidemia    Hypertension    Hypothyroidism    PSVT (paroxysmal supraventricular tachycardia) (HCC)    Seasonal allergies    Shingles    AGE 44   Stroke (HCC)    a. 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 03/2014 Echo: EF 60-65%, Gr 1 DD;  c. 03/2014 TEE: EF 60-65%, mod LVH, no rwma, No PFO/ASD;  d. 03/2014 Carotid U/S: 1-39% bilat stenosis;  e. 04/2014 s/p MDT  HWE99 Reveal Linq (ser#: [id number]).   Thyroid disease     Patient Active Problem List   Diagnosis Date Noted   ETOH abuse    CVA (cerebral vascular accident) (HCC) 04/15/2014   CVA (cerebral infarction) 04/15/2014   HTN (hypertension) 04/15/2014   HLD (hyperlipidemia) 04/15/2014   Hypothyroidism 04/15/2014   Stroke (HCC) 04/15/2014   Diplopia     Past Surgical History:  Procedure Laterality Date   loop recorder explant  07/07/2020   MDT LINQ removed by Dr Johney Frame   LOOP RECORDER IMPLANT N/A 04/17/2014   Procedure: LOOP RECORDER IMPLANT;  Surgeon: Gardiner Rhyme, MD;  Location: [facility] CATH LAB;  Service: Cardiovascular;  Laterality: N/A;   TEE WITHOUT CARDIOVERSION N/A 04/17/2014   Procedure: TRANSESOPHAGEAL ECHOCARDIOGRAM (TEE);  Surgeon: Lars Masson, MD;  Location: [facility] ENDOSCOPY;  Service: Cardiovascular;  Laterality: N/A;   TUBAL LIGATION       OB History   No obstetric history on file.     Family History  Problem Relation Age of Onset   Coronary artery disease Mother    Coronary artery disease Brother        # 1   Heart attack Brother        # 2    Social  History   Tobacco Use   Smoking status: Never   Smokeless tobacco: Never  Substance Use Topics   Alcohol use: Yes    Comment: Reports 1 bottle of wine at night 3 times per week   Drug use: No    Home Medications Prior to Admission medications   Medication Sig Start Date End Date Taking? Authorizing Provider  ALPRAZolam Prudy Feeler) 0.5 MG tablet Take 0.5 mg by mouth at bedtime as needed for anxiety.    [provider]  aspirin 81 MG chewable tablet Chew 1 tablet (81 mg total) by mouth daily. 04/17/14   Leatha Gilding, MD  atorvastatin (LIPITOR) 80 MG tablet Take 80 mg by mouth daily. 05/28/20   [provider]  DULoxetine (CYMBALTA) 60 MG capsule Take 60 mg by mouth daily.    [provider]  ezetimibe (ZETIA) 10 MG tablet Take 10 mg by mouth daily. 05/29/20   [provider]  levothyroxine (SYNTHROID) 125 MCG tablet Take 1 tablet by mouth daily. 06/10/20   [provider]  lisinopril-hydrochlorothiazide (PRINZIDE,ZESTORETIC) 20-25 MG per tablet Take 1 tablet by mouth daily.    [provider]  metoprolol succinate (TOPROL-XL) 100 MG 24 hr tablet Take 100 mg by mouth daily. Take with or immediately following a meal.    [provider]    Allergies    Amoxicillin  Review of Systems   Review of Systems  Constitutional:  Negative for fever.  HENT:  Negative for hearing loss, sore throat and tinnitus.   Eyes:  Negative for visual disturbance.  Respiratory:  Negative for shortness of breath.   Cardiovascular:  Negative for chest pain.  Gastrointestinal:  Negative for abdominal pain, diarrhea, nausea and vomiting.  Genitourinary:  Negative for dysuria.  Musculoskeletal:  Negative for neck pain.  Skin:  Negative for rash.  Neurological:  Positive for dizziness. Negative for weakness and headaches.   Physical Exam Updated Vital Signs BP (!) 155/97 (BP Location: Left Arm)   Pulse 73   Temp 98.8 F (37.1 C) (Oral)   Resp 18   Ht 5\' 1"  (1.549 m)   Wt 73.5 kg   SpO2 100%   BMI 30.61 kg/m   Physical Exam Vitals and nursing note reviewed.  Constitutional:      General: She is not in acute distress.    Appearance: Normal appearance. She is well-developed.  HENT:     Head: Normocephalic and atraumatic.  Eyes:     Extraocular Movements: Extraocular movements intact.     Conjunctiva/sclera: Conjunctivae normal.     Pupils: Pupils are equal, round, and reactive to light.  Cardiovascular:     Rate and Rhythm: Normal rate and regular rhythm.     Heart sounds: No murmur heard. Pulmonary:     Effort: Pulmonary effort is normal. No respiratory distress.     Breath sounds: Normal breath sounds.  Abdominal:     Palpations: Abdomen is soft.     Tenderness: There is no abdominal tenderness.  Musculoskeletal:        General: No  deformity or signs of injury. Normal range of motion.     Cervical back: Neck supple.  Skin:    General: Skin is warm and dry.  Neurological:     General: No focal deficit present.     Mental Status: She is alert and oriented to person, place, and time.     Cranial Nerves: No cranial nerve deficit.     Sensory: No  sensory deficit.     Motor: No weakness.     Coordination: Coordination normal.     Gait: Gait normal.     Comments: Patient has normal finger-nose-finger and heel shin.  No ataxic symptoms.  Did initially have a few beats of horizontal nystagmus but these quickly extinguished.    ED Results / Procedures / Treatments   Labs (all labs ordered are listed, but only abnormal results are displayed) Labs Reviewed  BASIC METABOLIC PANEL - Abnormal; Notable for the following components:      Result Value   Potassium 3.4 (*)    Glucose, Bld 119 (*)    All other components within normal limits  RESP PANEL BY RT-PCR (FLU A&B, COVID) ARPGX2  CBC WITH DIFFERENTIAL/PLATELET    EKG EKG Interpretation  Date/Time:  Saturday March 07 2021 15:59:00 EDT Ventricular Rate:  71 PR Interval:  160 QRS Duration: 88 QT Interval:  434 QTC Calculation: 472 R Axis:   34 Text Interpretation: Sinus rhythm Aberrant conduction of SV complex(es) Nonspecific T abnormalities, anterior leads No significant change since prior 11/15 Confirmed by Meridee Score [phone]) on 03/07/2021 4:11:48 PM  Radiology CT Head Wo Contrast  Result Date: 03/07/2021 CLINICAL DATA:  Neuro deficit, acute stroke suspected. New onset dizziness. EXAM: CT HEAD WITHOUT CONTRAST TECHNIQUE: Contiguous axial images were obtained from the base of the skull through the vertex without intravenous contrast. COMPARISON:  MR head 04/15/2014 FINDINGS: Brain: No evidence of acute infarction, hemorrhage, hydrocephalus, extra-axial collection or mass lesion/mass effect. Vascular: No hyperdense vessel or unexpected calcification. Skull:  Normal. Negative for fracture or focal lesion. Sinuses/Orbits: No acute finding. Other: None. IMPRESSION: No acute intracranial abnormality. Electronically Signed   By: Sherron Ales M.D.   On: 03/07/2021 16:57    Procedures Procedures   Medications Ordered in ED Medications - No data to display  ED Course  I have reviewed the triage vital signs and the nursing notes.  Pertinent labs & imaging results that were available during my care of the patient were reviewed by me and considered in my medical decision making (see chart for details).  Clinical Course as of 03/08/21 1111  Sat Mar 07, 2021  1710 Orthostatic vital signs were unremarkable. [MB]  1900 Patient is ambulated in the department and her dizziness is resolved.  Recommended close follow-up with her PCP.  Return instructions discussed [MB]    Clinical Course User Index [MB] Terrilee Files, MD   MDM Rules/Calculators/A&P                          This patient complains of dizziness room spinning; this involves an extensive number of treatment Options and is a complaint that carries with it a high risk of complications and Morbidity. The differential includes stroke, bleed, vertigo, dehydration, arrhythmia  I ordered, reviewed and interpreted labs, which included CBC with normal white count, chemistries normal other than mildly low potassium elevated glucose, COVID and flu testing negative  I ordered imaging studies which included CT head and I independently    visualized and interpreted imaging which showed no acute findings  Previous records obtained and reviewed in epic no recent admissions  After the interventions stated above, I reevaluated the patient and found patient be symptomatically improved.  orthpostatics negative ambulated in the department without any difficulty.  She is asking be discharged.  Recommended close follow-up with her primary care doctor.  Return instructions discussed   Final Clinical  Impression(s) / ED Diagnoses Final diagnoses:  Dizziness    Rx / DC Orders ED Discharge Orders     None        Terrilee Files, MD 03/08/21 1114

## 2021-03-07 NOTE — ED Notes (Signed)
Patient transported to CT 

## 2022-06-11 DIAGNOSIS — R0683 Snoring: Secondary | ICD-10-CM | POA: Diagnosis not present

## 2022-06-11 DIAGNOSIS — E782 Mixed hyperlipidemia: Secondary | ICD-10-CM | POA: Diagnosis not present

## 2022-06-11 DIAGNOSIS — R69 Illness, unspecified: Secondary | ICD-10-CM | POA: Diagnosis not present

## 2022-06-11 DIAGNOSIS — I10 Essential (primary) hypertension: Secondary | ICD-10-CM | POA: Diagnosis not present

## 2022-06-11 DIAGNOSIS — E039 Hypothyroidism, unspecified: Secondary | ICD-10-CM | POA: Diagnosis not present

## 2022-06-11 DIAGNOSIS — Z23 Encounter for immunization: Secondary | ICD-10-CM | POA: Diagnosis not present

## 2022-06-11 DIAGNOSIS — H5440 Blindness, one eye, unspecified eye: Secondary | ICD-10-CM | POA: Diagnosis not present

## 2022-06-11 DIAGNOSIS — Z8673 Personal history of transient ischemic attack (TIA), and cerebral infarction without residual deficits: Secondary | ICD-10-CM | POA: Diagnosis not present

## 2022-08-18 ENCOUNTER — Encounter (HOSPITAL_BASED_OUTPATIENT_CLINIC_OR_DEPARTMENT_OTHER): Payer: Self-pay

## 2022-08-18 ENCOUNTER — Emergency Department (HOSPITAL_BASED_OUTPATIENT_CLINIC_OR_DEPARTMENT_OTHER)
Admission: EM | Admit: 2022-08-18 | Discharge: 2022-08-18 | Disposition: A | Discharge status: Home / Self Care | Payer: Medicare HMO | Attending: Emergency Medicine | Admitting: Emergency Medicine

## 2022-08-18 ENCOUNTER — Other Ambulatory Visit: Payer: Self-pay

## 2022-08-18 DIAGNOSIS — I10 Essential (primary) hypertension: Secondary | ICD-10-CM | POA: Insufficient documentation

## 2022-08-18 DIAGNOSIS — R000 Tachycardia, unspecified: Secondary | ICD-10-CM | POA: Insufficient documentation

## 2022-08-18 DIAGNOSIS — Z7982 Long term (current) use of aspirin: Secondary | ICD-10-CM | POA: Insufficient documentation

## 2022-08-18 DIAGNOSIS — R0602 Shortness of breath: Secondary | ICD-10-CM | POA: Diagnosis not present

## 2022-08-18 DIAGNOSIS — Z8673 Personal history of transient ischemic attack (TIA), and cerebral infarction without residual deficits: Secondary | ICD-10-CM | POA: Diagnosis not present

## 2022-08-18 DIAGNOSIS — E876 Hypokalemia: Secondary | ICD-10-CM

## 2022-08-18 DIAGNOSIS — Z79899 Other long term (current) drug therapy: Secondary | ICD-10-CM | POA: Diagnosis not present

## 2022-08-18 LAB — CBC
HCT: 39.4 % (ref 36.0–46.0)
Hemoglobin: 13.6 g/dL (ref 12.0–15.0)
MCH: 29.5 pg (ref 26.0–34.0)
MCHC: 34.5 g/dL (ref 30.0–36.0)
MCV: 85.5 fL (ref 80.0–100.0)
Platelets: 292 10*3/uL (ref 150–400)
RBC: 4.61 MIL/uL (ref 3.87–5.11)
RDW: 11.9 % (ref 11.5–15.5)
WBC: 8.3 10*3/uL (ref 4.0–10.5)
nRBC: 0 % (ref 0.0–0.2)

## 2022-08-18 LAB — BASIC METABOLIC PANEL
Anion gap: 8 (ref 5–15)
BUN: 14 mg/dL (ref 8–23)
CO2: 29 mmol/L (ref 22–32)
Calcium: 10.1 mg/dL (ref 8.9–10.3)
Chloride: 103 mmol/L (ref 98–111)
Creatinine, Ser: 0.58 mg/dL (ref 0.44–1.00)
GFR, Estimated: >60 mL/min (ref >60)
Glucose, Bld: 172 mg/dL — ABNORMAL HIGH (ref 70–99)
Potassium: 3 mmol/L — ABNORMAL LOW (ref 3.5–5.1)
Sodium: 140 mmol/L (ref 135–145)

## 2022-08-18 MED ORDER — POTASSIUM CHLORIDE CRYS ER 20 MEQ PO TBCR
40.0000 meq | EXTENDED_RELEASE_TABLET | Freq: Once | ORAL | Status: AC
  Administered 2022-08-18: 40 meq via ORAL
  Filled 2022-08-18: qty 2

## 2022-08-18 NOTE — ED Triage Notes (Signed)
Patient here POV from Home.  Endorses taking the trash out about 1.5-2 Hours ago and became SOB. Noted some Tachycardia as well. Went to [facility] and sent for Evaluation. No Pain. SOB has gone away now.   NAD Noted during Triage. A&Ox4. GCS 15. BIB Wheelchair.

## 2022-08-18 NOTE — ED Provider Notes (Signed)
[place] Provider Note   CSN: [id number] Arrival date & time: 08/18/22  1257     History  Chief Complaint  Patient presents with   Shortness of Breath   HPI [name] Wagner is a 67 y.o. female with history of hypertension, hyperlipidemia and CVA presenting for shortness of breath.  Symptoms occurred this morning.  She was taking out the trash.  Noticed that she became short of breath and felt she had a fast heart rate as well.  States it lasted for 1 minute.  She was seen at urgent care.  Found to have a heart rate of 112 at that time.  Was no longer short of breath when she was seen.  She was prompted to come here for further evaluation.  Denies chest pain, denies shortness of breath at this time.  Denies cough or congestion.  Denies calf tenderness.   Shortness of Breath      Home Medications Prior to Admission medications   Medication Sig Start Date End Date Taking? Authorizing Provider  ALPRAZolam Duanne Moron) 0.5 MG tablet Take 0.5 mg by mouth at bedtime as needed for anxiety.    [provider]  aspirin 81 MG chewable tablet Chew 1 tablet (81 mg total) by mouth daily. 04/17/14   Caren Griffins, MD  atorvastatin (LIPITOR) 80 MG tablet Take 80 mg by mouth daily. 05/28/20   [provider]  DULoxetine (CYMBALTA) 60 MG capsule Take 60 mg by mouth daily.    [provider]  ezetimibe (ZETIA) 10 MG tablet Take 10 mg by mouth daily. 05/29/20   [provider]  levothyroxine (SYNTHROID) 125 MCG tablet Take 1 tablet by mouth daily. 06/10/20   [provider]  lisinopril-hydrochlorothiazide (PRINZIDE,ZESTORETIC) 20-25 MG per tablet Take 1 tablet by mouth daily.    [provider]  metoprolol succinate (TOPROL-XL) 100 MG 24 hr tablet Take 100 mg by mouth daily. Take with or immediately following a meal.    [provider]      Allergies    Amoxicillin    Review of Systems   Review  of Systems  Respiratory:  Positive for shortness of breath.     Physical Exam   Vitals:   08/18/22 1304 08/18/22 1306  BP:  119/85  Pulse: 95   Resp: 18   Temp: 97.6 F (36.4 C)   SpO2: 96%     CONSTITUTIONAL:  well-appearing, NAD NEURO:  Alert and oriented x 3, CN 3-12 grossly intact EYES:  eyes equal and reactive ENT/NECK:  Supple, no stridor  CARDIO: regular rate and rhythm, appears well-perfused  PULM:  No respiratory distress, CTAB GI/GU:  non-distended, soft MSK/SPINE:  No gross deformities, no edema, moves all extremities  SKIN:  no rash, atraumatic  *Additional and/or pertinent findings included in MDM below  ED Results / Procedures / Treatments   Labs (all labs ordered are listed, but only abnormal results are displayed) Labs Reviewed  BASIC METABOLIC PANEL - Abnormal; Notable for the following components:      Result Value   Potassium 3.0 (*)    Glucose, Bld 172 (*)    All other components within normal limits  CBC    EKG EKG Interpretation  Date/Time:  Wednesday August 18 2022 13:06:56 EDT Ventricular Rate:  95 PR Interval:  157 QRS Duration: 96 QT Interval:  427 QTC Calculation: 537 R Axis:   27 Text Interpretation: Sinus rhythm Borderline T abnormalities, anterior leads Prolonged  QT interval Confirmed by Vanetta Mulders [phone]) on 08/18/2022 2:40:10 PM  Radiology No results found.  Procedures Procedures    Medications Ordered in ED Medications  potassium chloride SA (KLOR-CON M) CR tablet 40 mEq (has no administration in time range)    ED Course/ Medical Decision Making/ A&P                             Medical Decision Making Amount and/or Complexity of Data Reviewed Labs: ordered.   67 year old well-appearing female presenting for shortness of breath and tachycardia.  Exam is unremarkable.  Patient was asymptomatic on arrival.  Given her risk factors however did want to trend troponins but have low suspicion for ACS as etiology.   Also wanted to order chest x-ray but low suspicion for pneumonia given no cough or congestion and no fever or white count.  Discussed working up further with serial troponins and x-ray but patient stated that she had no symptoms at this time and desired strongly to be discharged and stated that she did have any symptoms of any kind that she would return.  She declined further evaluation which given how clinically well she appeared without any symptoms I felt that that was okay so she followed up with her PCP.  States she does have close follow-up with her PCP.  I advised her to follow-up with her PCP in 3 to 4 days for reevaluation.  Discussed pertinent return precautions.  Was noted to be hypokalemic.  Treated with oral potassium.        Final Clinical Impression(s) / ED Diagnoses Final diagnoses:  Hypokalemia    Rx / DC Orders ED Discharge Orders     None         Gareth Eagle, PA-C 08/18/22 1445    Vanetta Mulders, MD 08/20/22 [phone]

## 2022-08-18 NOTE — Discharge Instructions (Signed)
Evaluation today revealed that your potassium was low which we treated with oral potassium supplement.  Given that you do not have shortness of breath or chest pain at this time, feel it is appropriate for you to be discharged with close follow-up with your PCP in the next 3 to 4 days.  If you do start to experience shortness of breath, chest pain, calf tenderness, trouble breathing or any other concerning symptom please return emergency department for further evaluation.

## 2022-08-22 IMAGING — CT CT HEAD W/O CM
4 series · 17 of 47 positions shown, 19 images · non-contrast
Comparison: MR head 04/15/2014

CLINICAL DATA: Neuro deficit, acute stroke suspected. New onset
dizziness.

EXAM:
CT HEAD WITHOUT CONTRAST
TECHNIQUE: Contiguous axial images were obtained from the base of the skull
through the vertex without intravenous contrast.

[Series 3: head without · axial · non-contrast · 0.43mm/px · z∈[-82,+48]mm · 7 of 36 slices shown, 9 images]
[im 5/36  brain]
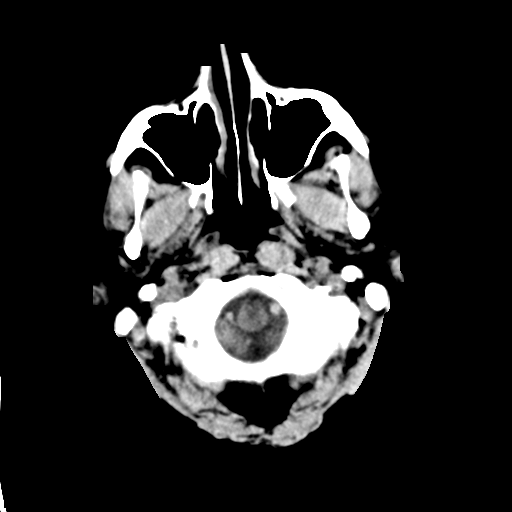
[im 5/36  bone]
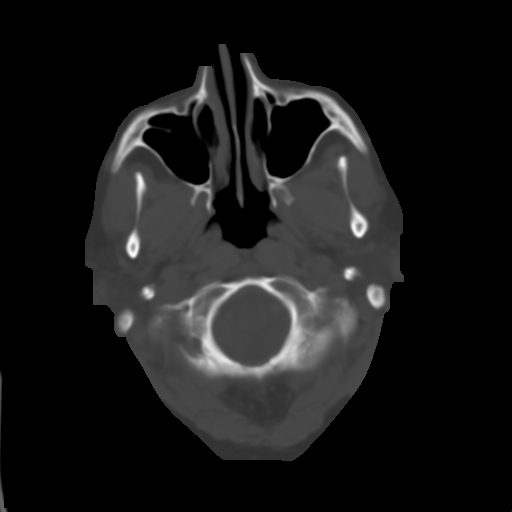
[im 9/36  brain]
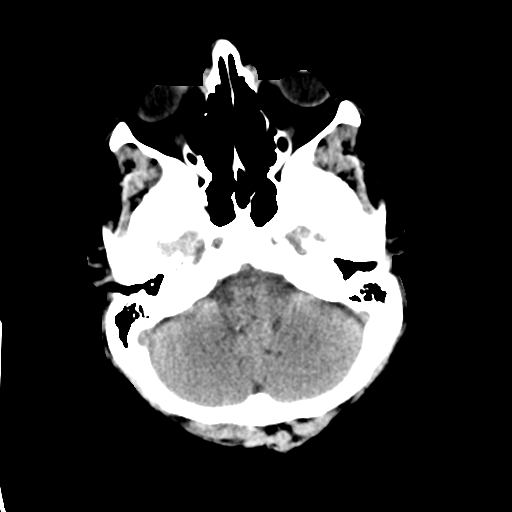
[im 14/36  brain]
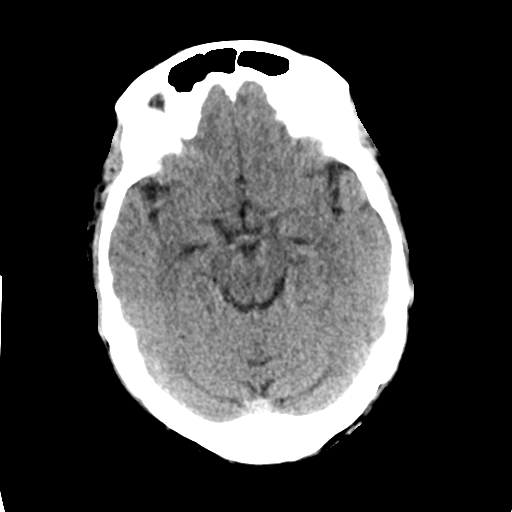
[im 18/36  brain]
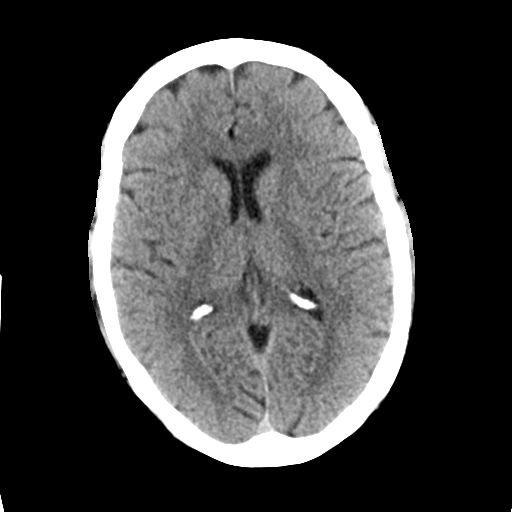
[im 22/36  brain]
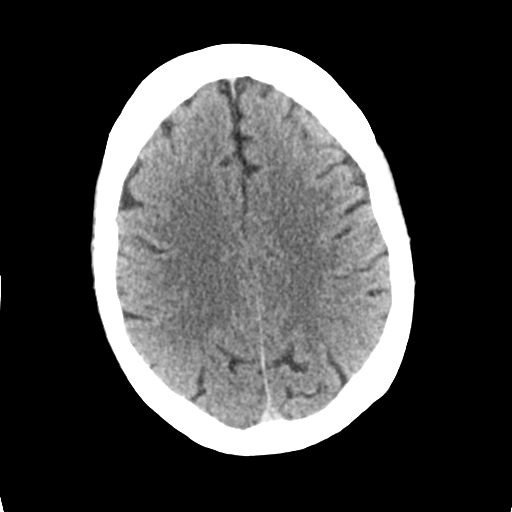
[im 22/36  bone]
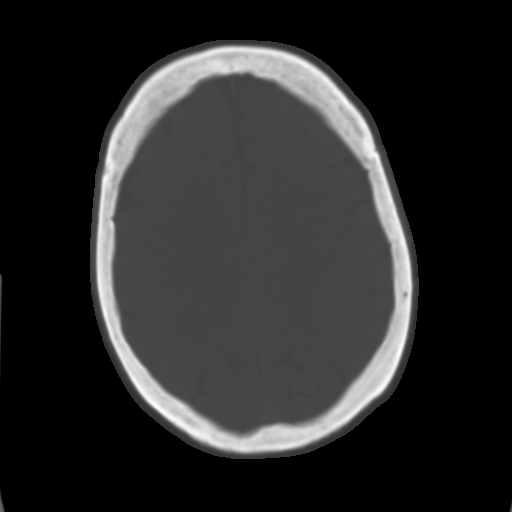
[im 27/36  brain]
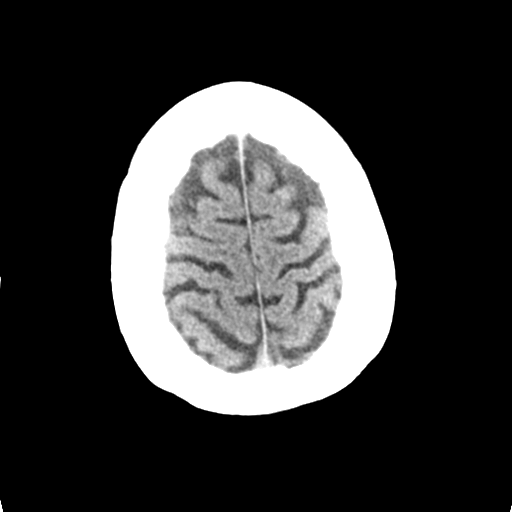
[im 31/36  brain]
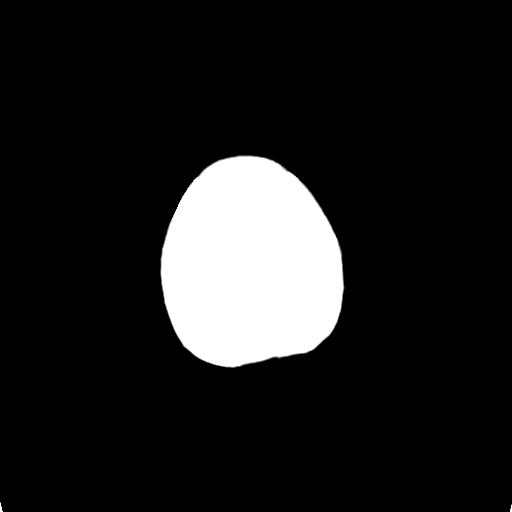

[Series 4: head bone · axial · 0.43mm/px · z∈[-86,-24]mm · 4 of 89 slices shown]
[im 9/89  bone]
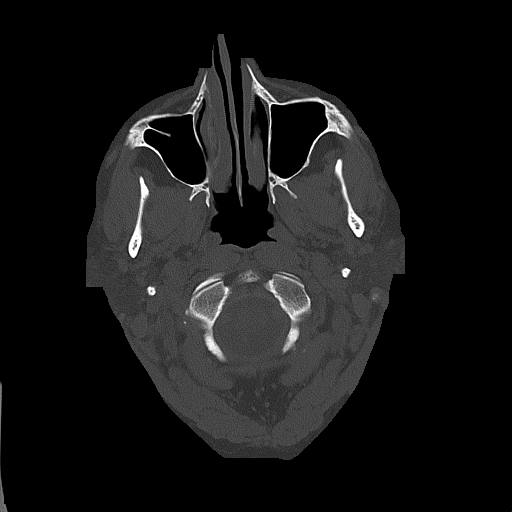
[im 18/89  bone]
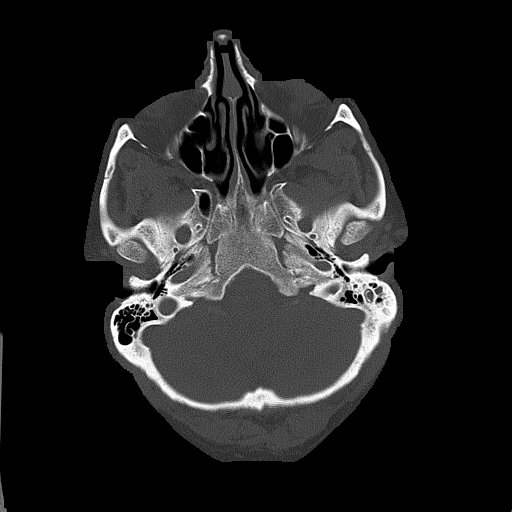
[im 27/89  bone]
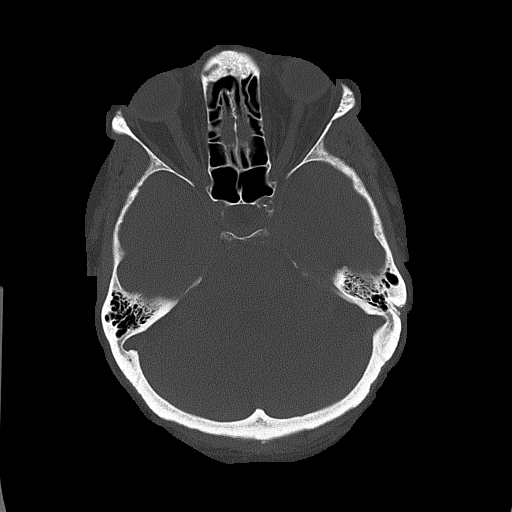
[im 40/89  bone]
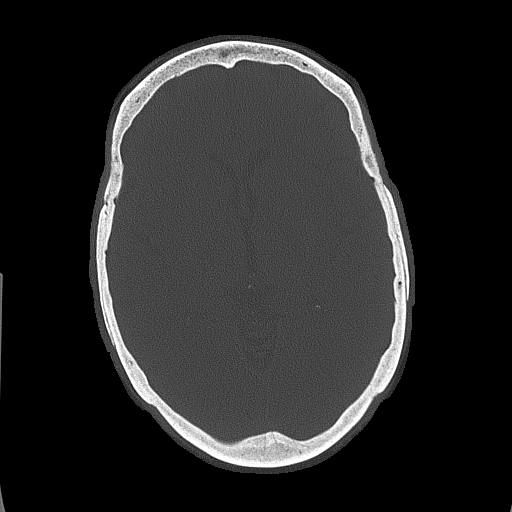

[Series 5: head without cor · coronal · non-contrast · 0.35mm/px · 3 of 73 slices shown]
[im 25/73  brain]
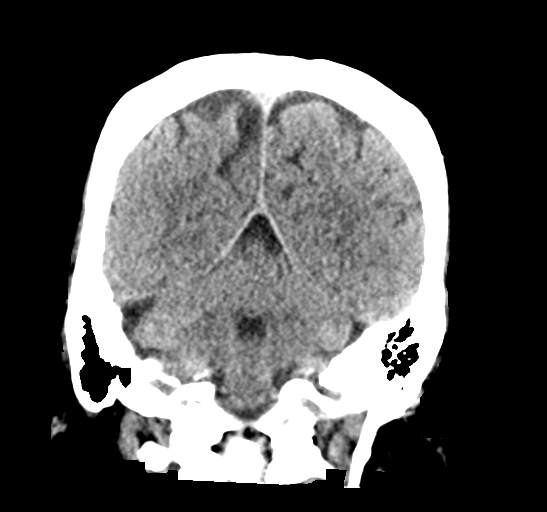
[im 33/73  brain]
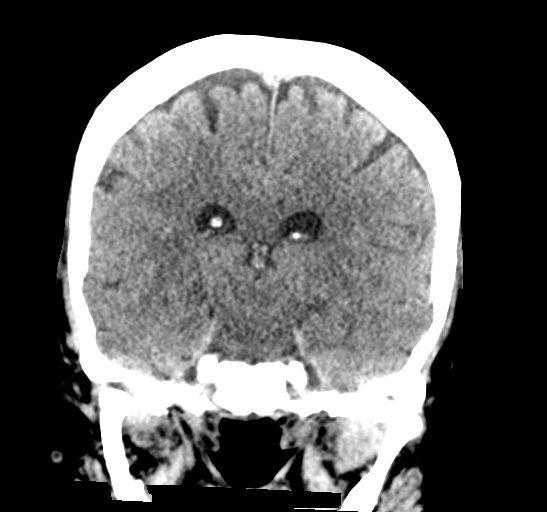
[im 41/73  brain]
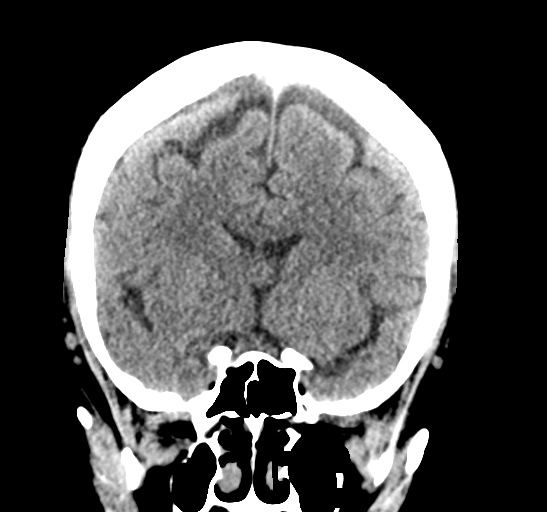

[Series 6: head without sag · sagittal · non-contrast · 0.35mm/px · 3 of 67 slices shown]
[im 23/67  brain]
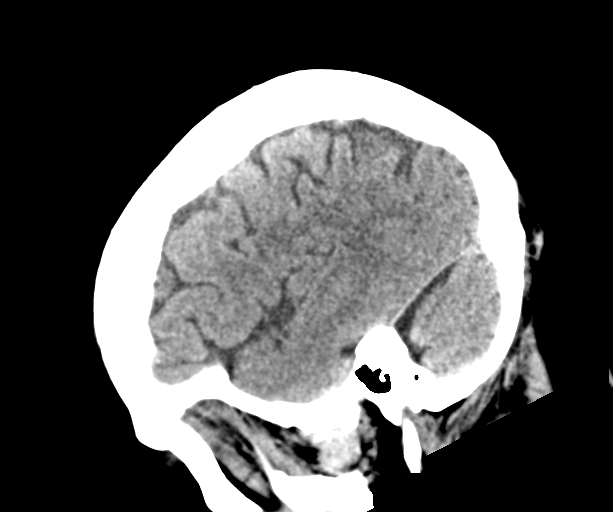
[im 34/67  brain]
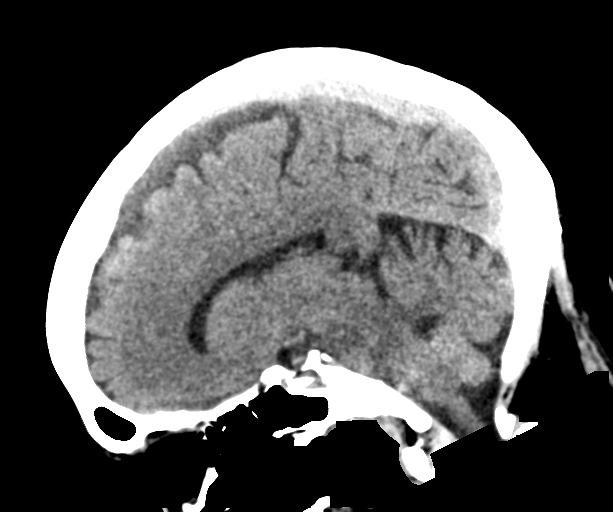
[im 45/67  brain]
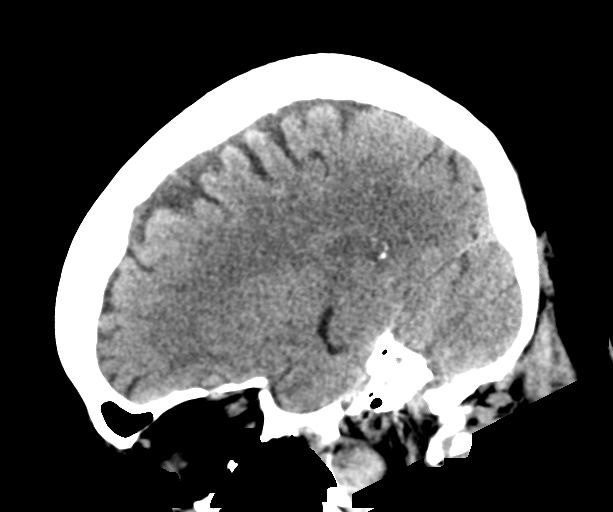

[17 of 47 positions shown; findings below may reference images not displayed]

FINDINGS: Brain: No evidence of acute infarction, hemorrhage, hydrocephalus,
extra-axial collection or mass lesion/mass effect.

Vascular: No hyperdense vessel or unexpected calcification.

Skull: Normal. Negative for fracture or focal lesion.

Sinuses/Orbits: No acute finding.

Other: None.
IMPRESSION: No acute intracranial abnormality.

## 2022-10-27 DIAGNOSIS — Z0100 Encounter for examination of eyes and vision without abnormal findings: Secondary | ICD-10-CM | POA: Diagnosis not present

## 2023-04-25 DIAGNOSIS — Z23 Encounter for immunization: Secondary | ICD-10-CM | POA: Diagnosis not present

## 2023-06-24 DIAGNOSIS — H5440 Blindness, one eye, unspecified eye: Secondary | ICD-10-CM | POA: Diagnosis not present

## 2023-06-24 DIAGNOSIS — Z8673 Personal history of transient ischemic attack (TIA), and cerebral infarction without residual deficits: Secondary | ICD-10-CM | POA: Diagnosis not present

## 2023-06-24 DIAGNOSIS — E2839 Other primary ovarian failure: Secondary | ICD-10-CM | POA: Diagnosis not present

## 2023-06-24 DIAGNOSIS — I10 Essential (primary) hypertension: Secondary | ICD-10-CM | POA: Diagnosis not present

## 2023-06-24 DIAGNOSIS — Z1159 Encounter for screening for other viral diseases: Secondary | ICD-10-CM | POA: Diagnosis not present

## 2023-06-24 DIAGNOSIS — F419 Anxiety disorder, unspecified: Secondary | ICD-10-CM | POA: Diagnosis not present

## 2023-06-24 DIAGNOSIS — F339 Major depressive disorder, recurrent, unspecified: Secondary | ICD-10-CM | POA: Diagnosis not present

## 2023-06-24 DIAGNOSIS — R002 Palpitations: Secondary | ICD-10-CM | POA: Diagnosis not present

## 2023-06-24 DIAGNOSIS — Z1331 Encounter for screening for depression: Secondary | ICD-10-CM | POA: Diagnosis not present

## 2023-06-24 DIAGNOSIS — E039 Hypothyroidism, unspecified: Secondary | ICD-10-CM | POA: Diagnosis not present

## 2023-06-24 DIAGNOSIS — E782 Mixed hyperlipidemia: Secondary | ICD-10-CM | POA: Diagnosis not present

## 2023-06-24 DIAGNOSIS — Z0000 Encounter for general adult medical examination without abnormal findings: Secondary | ICD-10-CM | POA: Diagnosis not present

## 2024-02-08 ENCOUNTER — Other Ambulatory Visit: Payer: Self-pay | Admitting: Family Medicine

## 2024-02-08 DIAGNOSIS — N6311 Unspecified lump in the right breast, upper outer quadrant: Secondary | ICD-10-CM
